# Patient Record
Sex: Female | Born: 1985 | Race: White | Hispanic: No | Marital: Married | Smoking: Current every day smoker
Health system: Southern US, Community
[De-identification: ages and names within clinical notes are randomized; demographics above are authoritative.]

## PROBLEM LIST (undated history)

## (undated) DIAGNOSIS — K219 Gastro-esophageal reflux disease without esophagitis: Secondary | ICD-10-CM

## (undated) DIAGNOSIS — D649 Anemia, unspecified: Secondary | ICD-10-CM

## (undated) DIAGNOSIS — J45909 Unspecified asthma, uncomplicated: Secondary | ICD-10-CM

## (undated) DIAGNOSIS — G43909 Migraine, unspecified, not intractable, without status migrainosus: Secondary | ICD-10-CM

## (undated) DIAGNOSIS — B019 Varicella without complication: Secondary | ICD-10-CM

## (undated) DIAGNOSIS — B977 Papillomavirus as the cause of diseases classified elsewhere: Secondary | ICD-10-CM

## (undated) DIAGNOSIS — R519 Headache, unspecified: Secondary | ICD-10-CM

## (undated) DIAGNOSIS — N946 Dysmenorrhea, unspecified: Secondary | ICD-10-CM

## (undated) DIAGNOSIS — F32A Depression, unspecified: Secondary | ICD-10-CM

## (undated) DIAGNOSIS — F419 Anxiety disorder, unspecified: Secondary | ICD-10-CM

## (undated) DIAGNOSIS — F329 Major depressive disorder, single episode, unspecified: Secondary | ICD-10-CM

## (undated) DIAGNOSIS — R51 Headache: Secondary | ICD-10-CM

## (undated) DIAGNOSIS — J302 Other seasonal allergic rhinitis: Secondary | ICD-10-CM

## (undated) HISTORY — DX: Unspecified asthma, uncomplicated: J45.909

## (undated) HISTORY — DX: Other seasonal allergic rhinitis: J30.2

## (undated) HISTORY — DX: Migraine, unspecified, not intractable, without status migrainosus: G43.909

## (undated) HISTORY — PX: PELVIC LAPAROSCOPY: SHX162

## (undated) HISTORY — DX: Varicella without complication: B01.9

## (undated) HISTORY — PX: WISDOM TOOTH EXTRACTION: SHX21

## (undated) HISTORY — DX: Headache: R51

## (undated) HISTORY — DX: Major depressive disorder, single episode, unspecified: F32.9

## (undated) HISTORY — DX: Gastro-esophageal reflux disease without esophagitis: K21.9

## (undated) HISTORY — PX: TONSILLECTOMY: SUR1361

## (undated) HISTORY — DX: Headache, unspecified: R51.9

## (undated) HISTORY — DX: Depression, unspecified: F32.A

## (undated) HISTORY — DX: Dysmenorrhea, unspecified: N94.6

## (undated) SURGERY — LAPAROSCOPY OPERATIVE
Anesthesia: General | Laterality: Bilateral

---

## 2004-09-17 ENCOUNTER — Emergency Department: Payer: Self-pay | Admitting: General Practice

## 2005-01-25 ENCOUNTER — Emergency Department: Payer: Self-pay | Admitting: Emergency Medicine

## 2005-01-26 ENCOUNTER — Ambulatory Visit: Payer: Self-pay | Admitting: Emergency Medicine

## 2005-09-12 ENCOUNTER — Emergency Department: Payer: Self-pay | Admitting: Internal Medicine

## 2005-09-13 ENCOUNTER — Inpatient Hospital Stay: Payer: Self-pay | Admitting: Internal Medicine

## 2008-08-10 ENCOUNTER — Emergency Department: Payer: Self-pay | Admitting: Emergency Medicine

## 2008-08-16 ENCOUNTER — Emergency Department: Payer: Self-pay | Admitting: Emergency Medicine

## 2008-09-29 DIAGNOSIS — F17210 Nicotine dependence, cigarettes, uncomplicated: Secondary | ICD-10-CM | POA: Insufficient documentation

## 2008-09-29 DIAGNOSIS — R87619 Unspecified abnormal cytological findings in specimens from cervix uteri: Secondary | ICD-10-CM | POA: Insufficient documentation

## 2009-03-03 ENCOUNTER — Ambulatory Visit: Payer: Self-pay | Admitting: Family Medicine

## 2009-05-02 ENCOUNTER — Emergency Department: Payer: Self-pay | Admitting: Emergency Medicine

## 2010-01-01 ENCOUNTER — Emergency Department (HOSPITAL_COMMUNITY): Admission: EM | Admit: 2010-01-01 | Discharge: 2010-01-01 | Payer: Self-pay | Admitting: Emergency Medicine

## 2011-10-02 ENCOUNTER — Emergency Department: Payer: Self-pay | Admitting: Emergency Medicine

## 2011-10-04 ENCOUNTER — Emergency Department: Payer: Self-pay | Admitting: Emergency Medicine

## 2013-05-07 ENCOUNTER — Emergency Department: Payer: Self-pay | Admitting: Internal Medicine

## 2013-05-07 LAB — URINALYSIS, COMPLETE
Bilirubin,UR: NEGATIVE
Glucose,UR: NEGATIVE mg/dL (ref 0–75)
Transitional Epi: 1
WBC UR: 516 /HPF (ref 0–5)

## 2014-05-05 ENCOUNTER — Emergency Department: Payer: Self-pay | Admitting: Emergency Medicine

## 2014-05-05 LAB — COMPREHENSIVE METABOLIC PANEL
ALBUMIN: 3.7 g/dL (ref 3.4–5.0)
ALK PHOS: 95 U/L
AST: 15 U/L (ref 15–37)
Anion Gap: 1 — ABNORMAL LOW (ref 7–16)
BUN: 10 mg/dL (ref 7–18)
Bilirubin,Total: 0.5 mg/dL (ref 0.2–1.0)
CO2: 32 mmol/L (ref 21–32)
CREATININE: 0.9 mg/dL (ref 0.60–1.30)
Calcium, Total: 8.7 mg/dL (ref 8.5–10.1)
Chloride: 106 mmol/L (ref 98–107)
GLUCOSE: 76 mg/dL (ref 65–99)
Osmolality: 275 (ref 275–301)
Potassium: 4.2 mmol/L (ref 3.5–5.1)
SGPT (ALT): 31 U/L
Sodium: 139 mmol/L (ref 136–145)
TOTAL PROTEIN: 7.4 g/dL (ref 6.4–8.2)

## 2014-05-05 LAB — URINALYSIS, COMPLETE
BILIRUBIN, UR: NEGATIVE
Blood: NEGATIVE
GLUCOSE, UR: NEGATIVE mg/dL (ref 0–75)
KETONE: NEGATIVE
NITRITE: NEGATIVE
PH: 6 (ref 4.5–8.0)
PROTEIN: NEGATIVE
RBC,UR: 2 /HPF (ref 0–5)
SPECIFIC GRAVITY: 1.019 (ref 1.003–1.030)
Squamous Epithelial: 1

## 2014-05-05 LAB — CBC WITH DIFFERENTIAL/PLATELET
BASOS PCT: 0.8 %
Basophil #: 0.1 10*3/uL (ref 0.0–0.1)
EOS ABS: 0.2 10*3/uL (ref 0.0–0.7)
Eosinophil %: 2.4 %
HCT: 45.5 % (ref 35.0–47.0)
HGB: 15.4 g/dL (ref 12.0–16.0)
LYMPHS PCT: 26.6 %
Lymphocyte #: 2.7 10*3/uL (ref 1.0–3.6)
MCH: 31.8 pg (ref 26.0–34.0)
MCHC: 33.8 g/dL (ref 32.0–36.0)
MCV: 94 fL (ref 80–100)
Monocyte #: 0.9 x10 3/mm (ref 0.2–0.9)
Monocyte %: 9.1 %
Neutrophil #: 6.1 10*3/uL (ref 1.4–6.5)
Neutrophil %: 61.1 %
Platelet: 217 10*3/uL (ref 150–440)
RBC: 4.83 10*6/uL (ref 3.80–5.20)
RDW: 13.3 % (ref 11.5–14.5)
WBC: 10 10*3/uL (ref 3.6–11.0)

## 2014-05-05 LAB — WET PREP, GENITAL

## 2014-05-06 LAB — GC/CHLAMYDIA PROBE AMP

## 2014-05-18 ENCOUNTER — Emergency Department: Payer: Self-pay | Admitting: Emergency Medicine

## 2014-12-10 ENCOUNTER — Emergency Department
Admission: EM | Admit: 2014-12-10 | Discharge: 2014-12-10 | Disposition: A | Discharge status: Home / Self Care | Payer: Medicaid Other | Attending: Emergency Medicine | Admitting: Emergency Medicine

## 2014-12-10 ENCOUNTER — Encounter: Payer: Self-pay | Admitting: *Deleted

## 2014-12-10 DIAGNOSIS — Z720 Tobacco use: Secondary | ICD-10-CM | POA: Insufficient documentation

## 2014-12-10 DIAGNOSIS — Z792 Long term (current) use of antibiotics: Secondary | ICD-10-CM | POA: Insufficient documentation

## 2014-12-10 DIAGNOSIS — N390 Urinary tract infection, site not specified: Secondary | ICD-10-CM | POA: Insufficient documentation

## 2014-12-10 DIAGNOSIS — Z3202 Encounter for pregnancy test, result negative: Secondary | ICD-10-CM | POA: Diagnosis not present

## 2014-12-10 DIAGNOSIS — Z880 Allergy status to penicillin: Secondary | ICD-10-CM | POA: Insufficient documentation

## 2014-12-10 DIAGNOSIS — R109 Unspecified abdominal pain: Secondary | ICD-10-CM | POA: Diagnosis present

## 2014-12-10 LAB — URINALYSIS COMPLETE WITH MICROSCOPIC (ARMC ONLY)
Bilirubin Urine: NEGATIVE
GLUCOSE, UA: NEGATIVE mg/dL
Ketones, ur: NEGATIVE mg/dL
NITRITE: NEGATIVE
PH: 5 (ref 5.0–8.0)
Protein, ur: NEGATIVE mg/dL
Specific Gravity, Urine: 1.027 (ref 1.005–1.030)

## 2014-12-10 LAB — BASIC METABOLIC PANEL
ANION GAP: 7 (ref 5–15)
BUN: 9 mg/dL (ref 6–20)
CALCIUM: 9.3 mg/dL (ref 8.9–10.3)
CHLORIDE: 106 mmol/L (ref 101–111)
CO2: 27 mmol/L (ref 22–32)
CREATININE: 0.77 mg/dL (ref 0.44–1.00)
GFR calc Af Amer: >60 mL/min (ref >60)
GFR calc non Af Amer: >60 mL/min (ref >60)
GLUCOSE: 98 mg/dL (ref 65–99)
Potassium: 4.3 mmol/L (ref 3.5–5.1)
Sodium: 140 mmol/L (ref 135–145)

## 2014-12-10 LAB — CBC WITH DIFFERENTIAL/PLATELET
BASOS PCT: 1 %
Basophils Absolute: 0.1 10*3/uL (ref 0–0.1)
EOS ABS: 0.4 10*3/uL (ref 0–0.7)
EOS PCT: 4 %
HCT: 44 % (ref 35.0–47.0)
Hemoglobin: 15.4 g/dL (ref 12.0–16.0)
Lymphocytes Relative: 35 %
Lymphs Abs: 3.2 10*3/uL (ref 1.0–3.6)
MCH: 32.3 pg (ref 26.0–34.0)
MCHC: 35.1 g/dL (ref 32.0–36.0)
MCV: 92.1 fL (ref 80.0–100.0)
Monocytes Absolute: 0.9 10*3/uL (ref 0.2–0.9)
Monocytes Relative: 10 %
NEUTROS PCT: 50 %
Neutro Abs: 4.8 10*3/uL (ref 1.4–6.5)
PLATELETS: 191 10*3/uL (ref 150–440)
RBC: 4.78 MIL/uL (ref 3.80–5.20)
RDW: 13.1 % (ref 11.5–14.5)
WBC: 9.4 10*3/uL (ref 3.6–11.0)

## 2014-12-10 LAB — POCT PREGNANCY, URINE: Preg Test, Ur: NEGATIVE

## 2014-12-10 LAB — HCG, QUANTITATIVE, PREGNANCY

## 2014-12-10 MED ORDER — CIPROFLOXACIN HCL 500 MG PO TABS
500.0000 mg | ORAL_TABLET | Freq: Once | ORAL | Status: AC
  Administered 2014-12-10: 500 mg via ORAL

## 2014-12-10 MED ORDER — CIPROFLOXACIN HCL 500 MG PO TABS: 500.0000 mg | ORAL_TABLET | Freq: Two times a day (BID) | ORAL | Status: AC

## 2014-12-10 MED ORDER — CIPROFLOXACIN HCL 500 MG PO TABS
ORAL_TABLET | ORAL | Status: AC
Start: 2014-12-10 — End: 2014-12-10
  Administered 2014-12-10: 500 mg via ORAL
  Filled 2014-12-10: qty 1

## 2014-12-10 NOTE — ED Provider Notes (Signed)
[facility] Emergency Department Provider Note  ____________________________________________  Time seen: 4:00  I have reviewed the triage vital signs and the nursing notes.   HISTORY  Chief Complaint Abdominal Pain      HPI Brittney Carpenter is a 29 y.o. female presents with history of positive home pregnancy test one week ago. Patient admits to actively trying to become pregnant as such no birth control at this time. Patient presents to the ER tonight with a history of accidentally slipp and fall striking her abdomen tonight.     No past medical history on file.  There are no active problems to display for this patient.   No past surgical history on file.  Current Outpatient Rx  Name  Route  Sig  Dispense  Refill  . ciprofloxacin (CIPRO) 500 MG tablet   Oral   Take 1 tablet (500 mg total) by mouth 2 (two) times daily.   10 tablet   0     Allergies Penicillins  No family history on file.  Social History History  Substance Use Topics  . Smoking status: Current Every Day Smoker  . Smokeless tobacco: Not on file  . Alcohol Use: No    Review of Systems  Constitutional: Negative for fever. Eyes: Negative for visual changes. ENT: Negative for sore throat. Cardiovascular: Negative for chest pain. Respiratory: Negative for shortness of breath. Gastrointestinal: Positive for abdominal pain. Negative for vomiting and diarrhea. Genitourinary: Negative for dysuria. Musculoskeletal: Negative for back pain. Skin: Negative for rash. Neurological: Negative for headaches, focal weakness or numbness.   10-point ROS otherwise negative.  ____________________________________________   PHYSICAL EXAM:  VITAL SIGNS: ED Triage Vitals  Enc Vitals Group     BP 12/10/14 0143 115/67 mmHg     Pulse Rate 12/10/14 0143 83     Resp 12/10/14 0143 20     Temp 12/10/14 0143 98.6 F (37 C)     Temp Source 12/10/14 0143 Oral     SpO2 12/10/14 0143 99 %      Weight 12/10/14 0143 167 lb (75.751 kg)     Height 12/10/14 0143  (1.575 m)     Head Cir --      Peak Flow --      Pain Score 12/10/14 0144 6     Pain Loc --      Pain Edu? --      Excl. in GC? --      Constitutional: Alert and oriented. Well appearing and in no distress. Eyes: Conjunctivae are normal. PERRL. Normal extraocular movements. ENT   Head: Normocephalic and atraumatic.   Nose: No congestion/rhinnorhea.   Mouth/Throat: Mucous membranes are moist.   Neck: No stridor. Hematological/Lymphatic/Immunilogical: No cervical lymphadenopathy. Cardiovascular: Normal rate, regular rhythm. Normal and symmetric distal pulses are present in all extremities. No murmurs, rubs, or gallops. Respiratory: Normal respiratory effort without tachypnea nor retractions. Breath sounds are clear and equal bilaterally. No wheezes/rales/rhonchi. Gastrointestinal: Soft and nontender. No distention. There is no CVA tenderness. Genitourinary: deferred Musculoskeletal: Nontender with normal range of motion in all extremities. No joint effusions.  No lower extremity tenderness nor edema. Neurologic:  Normal speech and language. No gross focal neurologic deficits are appreciated. Speech is normal.  Skin:  Skin is warm, dry and intact. No rash noted. Psychiatric: Mood and affect are normal. Speech and behavior are normal. Patient exhibits appropriate insight and judgment.  ____________________________________________    LABS (pertinent positives/negatives)  Labs Reviewed  URINALYSIS COMPLETEWITH MICROSCOPIC (  [facility] ONLY) - Abnormal; Notable for the following:    Color, Urine YELLOW (*)    APPearance HAZY (*)    Hgb urine dipstick 1+ (*)    Leukocytes, UA 1+ (*)    Bacteria, UA RARE (*)    Squamous Epithelial / LPF 6-30 (*)    All other components within normal limits  CBC WITH DIFFERENTIAL/PLATELET  BASIC METABOLIC PANEL  HCG, QUANTITATIVE, PREGNANCY  POC URINE PREG, ED  POCT  PREGNANCY, URINE         INITIAL IMPRESSION / ASSESSMENT AND PLAN / ED COURSE  Pertinent labs & imaging results that were available during my care of the patient were reviewed by me and considered in my medical decision making (see chart for details).  Patient's hCG quantitative less than 1 as such she was informed that she was not pregnant. Noted to have urinary tract infection on laboratory data as such ciprofloxacin was prescribed  ____________________________________________   FINAL CLINICAL IMPRESSION(S) / ED DIAGNOSES  Final diagnoses:  UTI (lower urinary tract infection)      Darci Current, MD 12/10/14 [phone]

## 2014-12-10 NOTE — Discharge Instructions (Signed)
Urinary Tract Infection °Urinary tract infections (UTIs) can develop anywhere along your urinary tract. Your urinary tract is your body's drainage system for removing wastes and extra water. Your urinary tract includes two kidneys, two ureters, a bladder, and a urethra. Your kidneys are a pair of bean-shaped organs. Each kidney is about the size of your fist. They are located below your ribs, one on each side of your spine. °CAUSES °Infections are caused by microbes, which are microscopic organisms, including fungi, viruses, and bacteria. These organisms are so small that they can only be seen through a microscope. Bacteria are the microbes that most commonly cause UTIs. °SYMPTOMS  °Symptoms of UTIs may vary by age and gender of the patient and by the location of the infection. Symptoms in young women typically include a frequent and intense urge to urinate and a painful, burning feeling in the bladder or urethra during urination. Older women and men are more likely to be tired, shaky, and weak and have muscle aches and abdominal pain. A fever may mean the infection is in your kidneys. Other symptoms of a kidney infection include pain in your back or sides below the ribs, nausea, and vomiting. °DIAGNOSIS °To diagnose a UTI, your caregiver will ask you about your symptoms. Your caregiver also will ask to provide a urine sample. The urine sample will be tested for bacteria and white blood cells. White blood cells are made by your body to help fight infection. °TREATMENT  °Typically, UTIs can be treated with medication. Because most UTIs are caused by a bacterial infection, they usually can be treated with the use of antibiotics. The choice of antibiotic and length of treatment depend on your symptoms and the type of bacteria causing your infection. °HOME CARE INSTRUCTIONS °· If you were prescribed antibiotics, take them exactly as your caregiver instructs you. Finish the medication even if you feel better after you  have only taken some of the medication. °· Drink enough water and fluids to keep your urine clear or pale yellow. °· Avoid caffeine, tea, and carbonated beverages. They tend to irritate your bladder. °· Empty your bladder often. Avoid holding urine for long periods of time. °· Empty your bladder before and after sexual intercourse. °· After a bowel movement, women should cleanse from front to back. Use each tissue only once. °SEEK MEDICAL CARE IF:  °· You have back pain. °· You develop a fever. °· Your symptoms do not begin to resolve within 3 days. °SEEK IMMEDIATE MEDICAL CARE IF:  °· You have severe back pain or lower abdominal pain. °· You develop chills. °· You have nausea or vomiting. °· You have continued burning or discomfort with urination. °MAKE SURE YOU:  °· Understand these instructions. °· Will watch your condition. °· Will get help right away if you are not doing well or get worse. °Document Released: 03/14/2005 Document Revised: 12/04/2011 Document Reviewed: 07/13/2011 °ExitCare® Patient Information ©2015 [employer]. This information is not intended to replace advice given to you by your health care provider. Make sure you discuss any questions you have with your health care provider. ° °

## 2014-12-10 NOTE — ED Notes (Signed)
Pt has low abd pain.  States she was outside tonight, slipped in the rain and fell.  Pt fell onto the sidewalk.  No vag bleeding.  States positive home preg test 1 week ago.

## 2015-02-08 ENCOUNTER — Ambulatory Visit (INDEPENDENT_AMBULATORY_CARE_PROVIDER_SITE_OTHER): Payer: Medicaid Other | Admitting: Family Medicine

## 2015-02-08 ENCOUNTER — Encounter: Payer: Self-pay | Admitting: Family Medicine

## 2015-02-08 VITALS — BP 108/75 | HR 81 | Resp 16 | Ht 63.0 in | Wt 165.6 lb

## 2015-02-08 DIAGNOSIS — Z7689 Persons encountering health services in other specified circumstances: Secondary | ICD-10-CM

## 2015-02-08 DIAGNOSIS — J4520 Mild intermittent asthma, uncomplicated: Secondary | ICD-10-CM

## 2015-02-08 DIAGNOSIS — F32A Depression, unspecified: Secondary | ICD-10-CM

## 2015-02-08 DIAGNOSIS — K2100 Gastro-esophageal reflux disease with esophagitis, without bleeding: Secondary | ICD-10-CM

## 2015-02-08 DIAGNOSIS — Z7189 Other specified counseling: Secondary | ICD-10-CM

## 2015-02-08 DIAGNOSIS — K219 Gastro-esophageal reflux disease without esophagitis: Secondary | ICD-10-CM | POA: Insufficient documentation

## 2015-02-08 DIAGNOSIS — F329 Major depressive disorder, single episode, unspecified: Secondary | ICD-10-CM

## 2015-02-08 DIAGNOSIS — J45909 Unspecified asthma, uncomplicated: Secondary | ICD-10-CM | POA: Insufficient documentation

## 2015-02-08 DIAGNOSIS — R102 Pelvic and perineal pain: Secondary | ICD-10-CM

## 2015-02-08 LAB — POCT URINALYSIS DIPSTICK
Bilirubin, UA: NEGATIVE
GLUCOSE UA: NEGATIVE
Ketones, UA: NEGATIVE
Leukocytes, UA: NEGATIVE
Nitrite, UA: NEGATIVE
UROBILINOGEN UA: 0.2
pH, UA: 8

## 2015-02-08 LAB — POCT URINE PREGNANCY: PREG TEST UR: NEGATIVE

## 2015-02-08 NOTE — Progress Notes (Signed)
Name: Brittney Carpenter   MRN: [id number]    DOB: 1985/11/22   Date:02/08/2015       Progress Note  Subjective  Chief Complaint  Chief Complaint  Patient presents with  . Establish Care  . Abdominal Pain    IUD removed x 67month ago. ER visit [facility] x 2 mo ago. LMP abnormal 01/19/2015. PAP 335mogo and no results were sent to patient from [facility].HX of Abnormal PAPand Family Hx Cervical Cancer and Cysts.     HPI  Here to establish care.  Hx. Of depression.  [facility] by [facility] Trying to get Pregnant.  Hd IUD removed ([place]) 3 mos. Ago.  Has had lower abd/pelvic pain ever since.  Has mild asthma and smokes.  Has GERD, conrtolled with OTC PPI.    No problem-specific assessment & plan notes found for this encounter.   Past Medical History  Diagnosis Date  . Chicken pox   . Seasonal allergies   . Asthma   . Depression   . Frequent headaches   . GERD (gastroesophageal reflux disease)   . Migraine     Past Surgical History  Procedure Laterality Date  . Tonsillectomy      Family History  Problem Relation Age of Onset  . Alcohol abuse Mother   . Colon polyps Mother   . Cancer Mother     Cervical  . Alcohol abuse Father     Social History   Social History  . Marital Status: Married    Spouse Name: N/A  . Number of Children: N/A  . Years of Education: N/A   Occupational History  . Not on file.   Social History Main Topics  . Smoking status: Current Every Day Smoker    Types: Cigarettes  . Smokeless tobacco: Never Used  . Alcohol Use: Yes  . Drug Use: No  . Sexual Activity: Yes     Comment: FAMILY PLANNING : Trying to get pregnant.    Other Topics Concern  . Not on file   Social History Narrative     Current outpatient prescriptions:  .  albuterol (PROVENTIL HFA;VENTOLIN HFA) 108 (90 BASE) MCG/ACT inhaler, Inhale 2 puffs into the lungs every 6 (six) hours as needed., Disp: , Rfl:  .  ibuprofen (ADVIL,MOTRIN) 400 MG tablet, Take 400 mg by  mouth every 6 (six) hours as needed., Disp: , Rfl:  .  Loratadine 10 MG CAPS, Take 10 mg by mouth daily., Disp: , Rfl:  .  omeprazole (PRILOSEC OTC) 20 MG tablet, Take 20 mg by mouth daily., Disp: , Rfl:  .  sertraline (ZOLOFT) 50 MG tablet, Take 50 mg by mouth daily., Disp: , Rfl:   Allergies  Allergen Reactions  . Penicillins Other (See Comments)     Review of Systems  Constitutional: Negative for fever, chills and malaise/fatigue.  HENT: Negative for hearing loss.   Eyes: Negative for blurred vision and double vision.  Respiratory: Positive for shortness of breath and wheezing. Negative for cough and sputum production.   Cardiovascular: Negative for chest pain, palpitations, orthopnea and leg swelling.  Gastrointestinal: Positive for abdominal pain. Negative for heartburn, nausea, vomiting and blood in stool.  Genitourinary: Negative for dysuria, urgency and frequency.  Skin: Negative for rash.  Neurological: Negative for dizziness, tingling, sensory change, focal weakness, weakness and headaches.  Psychiatric/Behavioral: Positive for depression. The patient is not nervous/anxious.       Objective  Filed Vitals:   02/08/15 1437  BP:  108/75  Pulse: 81  Resp: 16  Height: _0  (1.6 m)  Weight: 165 lb 9.6 oz (75.116 kg)    Physical Exam  Constitutional: She is well-developed, well-nourished, and in no distress. No distress.  HENT:  Head: Normocephalic and atraumatic.  Eyes: Conjunctivae and EOM are normal. Pupils are equal, round, and reactive to light. No scleral icterus.  Neck: Normal range of motion. Neck supple. Carotid bruit is not present. No thyromegaly present.  Cardiovascular: Normal rate, regular rhythm, normal heart sounds and intact distal pulses.  Exam reveals no gallop and no friction rub.   No murmur heard. Pulmonary/Chest: Effort normal and breath sounds normal. No respiratory distress. She has no wheezes. She has no rales.  Abdominal: Soft. She exhibits  no distension and no mass. There is no tenderness.  Musculoskeletal: She exhibits no edema.  Lymphadenopathy:    She has no cervical adenopathy.  Skin: Skin is warm and dry.  Vitals reviewed.      Recent Results (from the past 2160 hour(s))  CBC WITH DIFFERENTIAL     Status: None   Collection Time: 12/10/14  1:49 AM  Result Value Ref Range   WBC 9.4 3.6 - 11.0 K/uL   RBC 4.78 3.80 - 5.20 MIL/uL   Hemoglobin 15.4 12.0 - 16.0 g/dL   HCT 44.0 35.0 - 47.0 %   MCV 92.1 80.0 - 100.0 fL   MCH 32.3 26.0 - 34.0 pg   MCHC 35.1 32.0 - 36.0 g/dL   RDW 13.1 11.5 - 14.5 %   Platelets 191 150 - 440 K/uL   Neutrophils Relative % 50 %   Neutro Abs 4.8 1.4 - 6.5 K/uL   Lymphocytes Relative 35 %   Lymphs Abs 3.2 1.0 - 3.6 K/uL   Monocytes Relative 10 %   Monocytes Absolute 0.9 0.2 - 0.9 K/uL   Eosinophils Relative 4 %   Eosinophils Absolute 0.4 0 - 0.7 K/uL   Basophils Relative 1 %   Basophils Absolute 0.1 0 - 0.1 K/uL  Basic metabolic panel     Status: None   Collection Time: 12/10/14  1:49 AM  Result Value Ref Range   Sodium 140 135 - 145 mmol/L   Potassium 4.3 3.5 - 5.1 mmol/L   Chloride 106 101 - 111 mmol/L   CO2 27 22 - 32 mmol/L   Glucose, Bld 98 65 - 99 mg/dL   BUN 9 6 - 20 mg/dL   Creatinine, Ser 0.77 0.44 - 1.00 mg/dL   Calcium 9.3 8.9 - 10.3 mg/dL   GFR calc non Af Amer >60 >60 mL/min   GFR calc Af Amer >60 >60 mL/min    Comment: (NOTE) The eGFR has been calculated using the CKD EPI equation. This calculation has not been validated in all clinical situations. eGFR's persistently <60 mL/min signify possible Chronic Kidney Disease.    Anion gap 7 5 - 15  Urinalysis complete, with microscopic ([facility] only)     Status: Abnormal   Collection Time: 12/10/14  1:49 AM  Result Value Ref Range   Color, Urine YELLOW (A) YELLOW   APPearance HAZY (A) CLEAR   Glucose, UA NEGATIVE NEGATIVE mg/dL   Bilirubin Urine NEGATIVE NEGATIVE   Ketones, ur NEGATIVE NEGATIVE mg/dL   Specific  Gravity, Urine 1.027 1.005 - 1.030   Hgb urine dipstick 1+ (A) NEGATIVE   pH 5.0 5.0 - 8.0   Protein, ur NEGATIVE NEGATIVE mg/dL   Nitrite NEGATIVE NEGATIVE   Leukocytes, UA 1+ (  A) NEGATIVE   RBC / HPF 0-5 0 - 5 RBC/hpf   WBC, UA 6-30 0 - 5 WBC/hpf   Bacteria, UA RARE (A) NONE SEEN   Squamous Epithelial / LPF 6-30 (A) NONE SEEN   Mucous PRESENT    Ca Oxalate Crys, UA PRESENT   hCG, quantitative, pregnancy     Status: None   Collection Time: 12/10/14  1:49 AM  Result Value Ref Range   hCG, Beta Chain, Quant, S <1 <5 mIU/mL    Comment:          GEST. AGE      CONC.  (mIU/mL)   <=1 WEEK        5 - 50     2 WEEKS       50 - 500     3 WEEKS       100 - 10,000     4 WEEKS     1,000 - 30,000     5 WEEKS     3,500 - 115,000   6-8 WEEKS     12,000 - 270,000    12 WEEKS     15,000 - 220,000        FEMALE AND NON-PREGNANT FEMALE:     LESS THAN 5 mIU/mL   Pregnancy, urine POC     Status: None   Collection Time: 12/10/14  1:57 AM  Result Value Ref Range   Preg Test, Ur NEGATIVE NEGATIVE    Comment:        THE SENSITIVITY OF THIS METHODOLOGY IS >24 mIU/mL   POCT urine pregnancy     Status: Normal   Collection Time: 02/08/15  2:58 PM  Result Value Ref Range   Preg Test, Ur Negative Negative  POCT urinalysis dipstick     Status: Abnormal   Collection Time: 02/08/15  3:00 PM  Result Value Ref Range   Color, UA Straw    Clarity, UA Cloudy    Glucose, UA Negative    Bilirubin, UA Negative    Ketones, UA Negative    Spec Grav, UA <=1.005    Blood, UA TRACE    pH, UA 8.0    Protein, UA TRACE    Urobilinogen, UA 0.2    Nitrite, UA Negative    Leukocytes, UA Negative Negative     Assessment & Plan  Problem List Items Addressed This Visit      Respiratory   Asthma   Relevant Medications   albuterol (PROVENTIL HFA;VENTOLIN HFA) 108 (90 BASE) MCG/ACT inhaler     Digestive   Acid reflux   Relevant Medications   omeprazole (PRILOSEC OTC) 20 MG tablet     Other   Depression    Relevant Medications   sertraline (ZOLOFT) 50 MG tablet    Other Visit Diagnoses    Encounter to establish care    -  Primary    Pain of upper abdomen        Relevant Orders    POCT urinalysis dipstick (Completed)    POCT urine pregnancy (Completed)       Meds ordered this encounter  Medications  . albuterol (PROVENTIL HFA;VENTOLIN HFA) 108 (90 BASE) MCG/ACT inhaler    Sig: Inhale 2 puffs into the lungs every 6 (six) hours as needed.  Marland Kitchen ibuprofen (ADVIL,MOTRIN) 400 MG tablet    Sig: Take 400 mg by mouth every 6 (six) hours as needed.  . sertraline (ZOLOFT) 50 MG tablet    Sig: Take 50 mg  by mouth daily.  Marland Kitchen omeprazole (PRILOSEC OTC) 20 MG tablet    Sig: Take 20 mg by mouth daily.  . Loratadine 10 MG CAPS    Sig: Take 10 mg by mouth daily.   1. Encounter to establish care   2. Pelvic pain in female  - POCT urinalysis dipstick - POCT urine pregnancy - CBC with Differential - Comprehensive Metabolic Panel (CMET) - Ambulatory referral to Gynecology  3. Depression   4. Gastroesophageal reflux disease with esophagitis  - CBC with Differential  5. Asthma, mild intermittent, uncomplicated

## 2015-02-08 NOTE — Patient Instructions (Addendum)
Discussed stopping smoking.  Get release of med info form signed for Health Dept records  [facility])

## 2015-03-14 ENCOUNTER — Telehealth: Payer: Self-pay | Admitting: Family Medicine

## 2015-03-14 NOTE — Telephone Encounter (Signed)
Pt was calling about [place]. Pt has not had bloodwork done yet. Pt informed she would have US done through [facility]. Nothing further needed.[place]

## 2015-03-14 NOTE — Telephone Encounter (Signed)
Pt return call pt call call back # is  # [phone]

## 2015-03-17 ENCOUNTER — Ambulatory Visit: Payer: Medicaid Other | Admitting: Family Medicine

## 2015-03-23 ENCOUNTER — Encounter: Payer: Self-pay | Admitting: Obstetrics and Gynecology

## 2015-03-23 ENCOUNTER — Ambulatory Visit (INDEPENDENT_AMBULATORY_CARE_PROVIDER_SITE_OTHER): Payer: Medicaid Other | Admitting: Obstetrics and Gynecology

## 2015-03-23 VITALS — BP 113/75 | HR 77 | Ht 62.0 in | Wt 163.5 lb

## 2015-03-23 DIAGNOSIS — R102 Pelvic and perineal pain: Secondary | ICD-10-CM | POA: Diagnosis not present

## 2015-03-23 DIAGNOSIS — N946 Dysmenorrhea, unspecified: Secondary | ICD-10-CM | POA: Diagnosis not present

## 2015-03-23 DIAGNOSIS — Z8742 Personal history of other diseases of the female genital tract: Secondary | ICD-10-CM

## 2015-03-23 DIAGNOSIS — N941 Unspecified dyspareunia: Secondary | ICD-10-CM | POA: Insufficient documentation

## 2015-03-23 DIAGNOSIS — F526 Dyspareunia not due to a substance or known physiological condition: Secondary | ICD-10-CM

## 2015-03-23 DIAGNOSIS — N912 Amenorrhea, unspecified: Secondary | ICD-10-CM

## 2015-03-23 DIAGNOSIS — G8929 Other chronic pain: Secondary | ICD-10-CM | POA: Diagnosis not present

## 2015-03-23 DIAGNOSIS — N949 Unspecified condition associated with female genital organs and menstrual cycle: Secondary | ICD-10-CM

## 2015-03-23 DIAGNOSIS — IMO0001 Reserved for inherently not codable concepts without codable children: Secondary | ICD-10-CM

## 2015-03-23 LAB — POCT URINALYSIS DIPSTICK
BILIRUBIN UA: NEGATIVE
Glucose, UA: NEGATIVE
KETONES UA: NEGATIVE
Nitrite, UA: NEGATIVE
PH UA: 6
Protein, UA: NEGATIVE
Spec Grav, UA: 1.01
Urobilinogen, UA: 0.2

## 2015-03-23 LAB — POCT URINE PREGNANCY: Preg Test, Ur: NEGATIVE

## 2015-03-23 NOTE — Patient Instructions (Signed)
1.Urine pregnancy test is negative.  Exam is suggestive of starting menses at this time. 2.  Ultrasound is scheduled. 3.  Laparoscopy with peritoneal biopsies recommended and scheduled to rule out endometriosis. 4.  Urine chlamydia/gonorrhea testing is completed. 5.  Continue with Advil and tramadol for pain relief as currently taking it. 6.  Return for preop appointment. 7.  Literature on endometriosis and laparoscopy is given.

## 2015-03-23 NOTE — Progress Notes (Signed)
NEW PATIENT EVALUATION.  Referring physician: Dr. Venora Maples, Junior Chief complaint: Abdominal pain.  OBJECTIVE: The patient is a 29 year old white single female, para 76, in a monogamous relationship, currently trying to conceive following IUD removal in June 2016, presents in referral from Dr. Venora Maples, Junior, for evaluation of recent exacerbation of chronic pelvic pain.    Past GYN history: Patient had regular cycles prior to Mirena IUD use. Long history of severe dysmenorrhea with midline cramping and occasional low backache. Birth control pills help decrease dysmenorrhea symptomatology. Patient has lost work and school time due to dysmenorrhea. Over the past 5 years with IUD insertion, patient has been asymptomatic and amenorrheic. Since IUD removal.  The patient has had return of regular cycles.  She does experience chronic pelvic pain on and every other day basis and has exacerbations with deep thrusting dyspareunia with the pain lasting greater than 24 hours after sex.  She has been having to take Advil.  Tramadol for the pain. Patient is also experiencing painful defecation. Patient also has been experiencing nausea with 7 pound weight loss since her IUD removal. No family history of endometriosis to her knowledge.  Past Medical History  Diagnosis Date  . Chicken pox   . Seasonal allergies   . Asthma   . Depression   . Frequent headaches   . GERD (gastroesophageal reflux disease)   . Migraine    Past Surgical History  Procedure Laterality Date  . Tonsillectomy     PAST OB HISTORY: G1, SVD, 6 lbs. 10 oz. Female. G2 SVD, 6 lbs. 9 oz. Female, G3, SVD 7 lbs. 0 oz. Female  FAMILY HISTORY: TWO Maternal aunts Have had female cancers, uncertainType Breast cancer-maternal aunt. Cervical cancer-mom  SOCIAL HISTORY: Tobacco user-1 pack per day. Alcohol use-social. Drug use-Negative.  OBJECTIVE: BP 113/75 mmHg  Pulse 77  Ht  (1.575 m)  Wt 163 lb 8 oz  (74.163 kg)  BMI 29.90 kg/m2  LMP 02/15/2015 (Exact Date)  Pleasant, well-appearing white female in no acute distress. Alert and oriented. Neck-supple without thyromegaly or adenopathy. Back-no CVA tenderness. Abdomen-soft, nontender, without organomegaly.  Area. Pelvic exam: External genitalia and-normal BUS-normal. Vagina-normal. Cervix-minimal blood loss (consistent with menses); mild cervical motion tenderness 1/4;; No gross lesions Uterus-midplane, mobile, tender 2/4, normal size and shape. Adnexa-nonpalpable, nontender. Rectovaginal-normal.  External exam; normal sphincter tone,; posterior uterine tenderness  2/4 Extremities-without clubbing, cyanosis or edema Musculoskeletal - normal. Skin-no lesions; tattoo, right lower abdomen  ASSESSMENT: 1.  Chronic pelvic pain, worsening, with dysmenorrhea and deep thrusting dyspareunia; cannot rule out endometriosis or infectious etiology (doubtful). 2.  Amenorrhea-physical exam suggestive of menses starting today.  PLAN: 1.  UPT-negative. 2.  Pelvic ultrasound. 3.  Laparoscopy with peritoneal biopsies scheduled. 4.  Continue with Advil and tramadol for pain relief. 5.  Return for preop appointment. 6.  Literature on endometriosis and laparoscopy

## 2015-03-24 LAB — GC/CHLAMYDIA PROBE AMP
CHLAMYDIA, DNA PROBE: NEGATIVE
Neisseria gonorrhoeae by PCR: NEGATIVE

## 2015-03-24 LAB — URINE CULTURE: ORGANISM ID, BACTERIA: NO GROWTH

## 2015-03-29 ENCOUNTER — Telehealth: Payer: Self-pay | Admitting: Family Medicine

## 2015-03-29 ENCOUNTER — Ambulatory Visit: Payer: Medicaid Other

## 2015-03-29 DIAGNOSIS — R102 Pelvic and perineal pain: Secondary | ICD-10-CM

## 2015-03-29 LAB — CBC WITH DIFFERENTIAL/PLATELET
BASOS ABS: 0 10*3/uL (ref 0.0–0.2)
Basos: 0 %
EOS (ABSOLUTE): 0.3 10*3/uL (ref 0.0–0.4)
Eos: 4 %
HEMOGLOBIN: 15.2 g/dL (ref 11.1–15.9)
Hematocrit: 43.4 % (ref 34.0–46.6)
IMMATURE GRANS (ABS): 0 10*3/uL (ref 0.0–0.1)
IMMATURE GRANULOCYTES: 0 %
LYMPHS: 34 %
Lymphocytes Absolute: 2.4 10*3/uL (ref 0.7–3.1)
MCH: 31.8 pg (ref 26.6–33.0)
MCHC: 35 g/dL (ref 31.5–35.7)
MCV: 91 fL (ref 79–97)
MONOCYTES: 8 %
Monocytes Absolute: 0.6 10*3/uL (ref 0.1–0.9)
NEUTROS ABS: 3.8 10*3/uL (ref 1.4–7.0)
NEUTROS PCT: 54 %
Platelets: 201 10*3/uL (ref 150–379)
RBC: 4.78 x10E6/uL (ref 3.77–5.28)
RDW: 13.1 % (ref 12.3–15.4)
WBC: 7.1 10*3/uL (ref 3.4–10.8)

## 2015-03-29 NOTE — Telephone Encounter (Signed)
We will call patient once Dr. Juanetta Gosling replies.

## 2015-03-29 NOTE — Telephone Encounter (Signed)
Pt states she had her labs, [place] this  morning

## 2015-03-30 LAB — COMPREHENSIVE METABOLIC PANEL
A/G RATIO: 1.8 (ref 1.1–2.5)
ALK PHOS: 81 IU/L (ref 39–117)
ALT: 25 IU/L (ref 0–32)
AST: 21 IU/L (ref 0–40)
Albumin: 4.2 g/dL (ref 3.5–5.5)
BUN/Creatinine Ratio: 10 (ref 8–20)
BUN: 8 mg/dL (ref 6–20)
Bilirubin Total: 0.3 mg/dL (ref 0.0–1.2)
CO2: 25 mmol/L (ref 18–29)
Calcium: 9.1 mg/dL (ref 8.7–10.2)
Chloride: 103 mmol/L (ref 97–108)
Creatinine, Ser: 0.84 mg/dL (ref 0.57–1.00)
GFR calc Af Amer: 108 mL/min/{1.73_m2} (ref >59)
GFR, EST NON AFRICAN AMERICAN: 94 mL/min/{1.73_m2} (ref >59)
GLOBULIN, TOTAL: 2.4 g/dL (ref 1.5–4.5)
Glucose: 95 mg/dL (ref 65–99)
POTASSIUM: 4.2 mmol/L (ref 3.5–5.2)
SODIUM: 142 mmol/L (ref 134–144)
Total Protein: 6.6 g/dL (ref 6.0–8.5)

## 2015-03-31 ENCOUNTER — Telehealth: Payer: Self-pay | Admitting: Family Medicine

## 2015-03-31 NOTE — Telephone Encounter (Signed)
Pt was returning nurses call pt call back # is [phone]

## 2015-03-31 NOTE — Telephone Encounter (Signed)
Patient aware of labs and no pregnancy at this time. Patient says she is having a lot of pain and requesting something for pain. Pt informed that she can call gyn and ask for something.

## 2015-05-17 ENCOUNTER — Telehealth: Payer: Self-pay | Admitting: Obstetrics and Gynecology

## 2015-05-17 NOTE — Telephone Encounter (Signed)
Pt is taking tramadol waiting on surgery. It is not working, she wants something stronger, no vicodin makes her sick.  [facility]

## 2015-05-18 ENCOUNTER — Telehealth: Payer: Self-pay | Admitting: Obstetrics and Gynecology

## 2015-05-18 ENCOUNTER — Ambulatory Visit: Payer: Medicaid Other | Admitting: Family Medicine

## 2015-05-18 NOTE — Telephone Encounter (Signed)
Pt called and she stated that she called yesterday to get something for pain, and she was told that you were going to talk to Dr Tommi Rumpse to see if he would allow her to have something, pt was calling in to find out if her was going to give her something, she stated if you need to call her you can leave a message if she does not answer to [place] a [place]

## 2015-05-19 ENCOUNTER — Ambulatory Visit (INDEPENDENT_AMBULATORY_CARE_PROVIDER_SITE_OTHER): Payer: Medicaid Other | Admitting: Obstetrics and Gynecology

## 2015-05-19 ENCOUNTER — Encounter: Payer: Self-pay | Admitting: Obstetrics and Gynecology

## 2015-05-19 VITALS — BP 103/70 | HR 118 | Ht 61.0 in | Wt 160.4 lb

## 2015-05-19 DIAGNOSIS — N926 Irregular menstruation, unspecified: Secondary | ICD-10-CM | POA: Diagnosis not present

## 2015-05-19 DIAGNOSIS — Z3201 Encounter for pregnancy test, result positive: Secondary | ICD-10-CM

## 2015-05-19 LAB — POCT URINE PREGNANCY: Preg Test, Ur: POSITIVE — AB

## 2015-05-19 NOTE — Patient Instructions (Signed)
1.  Surgery is canceled because of positive pregnancy test. 2.  Since menstrual cycles are irregular, we will obtain ultrasound in 2 weeks in order to confirm gestational age [facility]

## 2015-05-19 NOTE — Progress Notes (Signed)
Patient ID: Brittney Carpenter, female   DOB: January 17, 1986, 29 y.o.   MRN: [id number]   Chief complaint: 1.  Preop. 2.  Chronic pelvic pain.  Pre- op for lap and bx- cpp hpt -pos yesterday upt pos in office lmp-04/19/2015; Menstrual cycles are irregular edd- 01/24/2016 1048w2d G4 P3003  Pos nausea- x 1week  OBJECTIVE: BP 103/70 mmHg  Pulse 118  Ht 5\' 1"  (1.549 m)  Wt 160 lb 6.4 oz (72.757 kg)  BMI 30.32 kg/m2  LMP 04/19/2015 Pleasant, well-appearing white female in no acute distress. Back: No CVA tenderness. Abdomen: Soft, nontender, without organomegaly. Pelvic exam: External genitalia normal BUS-normal. Vagina-normal Cervix-parous; no cervical motion tenderness Uterus-top normal size, mobile, nontender Adnexa-nonpalpable and nontender. Rectovaginal-normal.  External exam  ASSESSMENT: 1.  Positive UPT in office, following a positive home pregnancy test. 2.  Patient was scheduled for preop appointment for surgery today.  PLAN: 1.  Postpone laparoscopy. 2.  Return in 2 weeks for ultrasound to confirm gestational age and EDC  A total of 15 minutes were spent face-to-face with the patient during this encounter and over half of that time dealt with counseling and coordination of care.  Herold HarmsMartin A Kalayah Leske, MD  Note: This dictation was prepared with Dragon dictation along with smaller phrase technology. Any transcriptional errors that result from this process are unintentional.

## 2015-05-19 NOTE — Telephone Encounter (Signed)
appt with mad today-

## 2015-05-19 NOTE — H&P (Deleted)
  PREOPERATIVE HISTORY AND PHYSICAL  Date of surgery: 05/23/2015 Chief complaint: Abdominal pain.  SUBJECTIVE: The patient is a 29 year old white single female, para [id number], in a monogamous relationship, currently trying to conceive following IUD removal in June 2016, presents in referral from Dr. Venora MaplesJames Hawkins, Junior, for Surgical evaluation of recent exacerbation of chronic pelvic pain.   Past GYN history: Patient had regular cycles prior to Mirena IUD use. Long history of severe dysmenorrhea with midline cramping and occasional low backache. Birth control pills help decrease dysmenorrhea symptomatology. Patient has lost work and school time due to dysmenorrhea. Over the past 5 years with IUD insertion, patient has been asymptomatic and amenorrheic. Since IUD removal. The patient has had return of regular cycles. She does experience chronic pelvic pain on and every other day basis and has exacerbations with deep thrusting dyspareunia with the pain lasting greater than 24 hours after sex. She has been having to take Advil. Tramadol for the pain. Patient is also experiencing painful defecation. Patient also has been experiencing nausea with 7 pound weight loss since her IUD removal. No family history of endometriosis to her knowledge.  Past Medical History  Diagnosis Date  . Chicken pox   . Seasonal allergies   . Asthma   . Depression   . Frequent headaches   . GERD (gastroesophageal reflux disease)   . Migraine    Past Surgical History  Procedure Laterality Date  . Tonsillectomy     PAST OB HISTORY: G1, SVD, 6 lbs. 10 oz. Female. G2 SVD, 6 lbs. 9 oz. Female, G3, SVD 7 lbs. 0 oz. Female  FAMILY HISTORY: TWO Maternal aunts Have had female cancers, uncertainType Breast cancer-maternal aunt. Cervical cancer-mom  SOCIAL HISTORY: Tobacco user-1 pack per day. Alcohol use-social. Drug use-Negative.  OBJECTIVE: BP 103/70 mmHg  Pulse 118   Ht 5\' 1"  (1.549 m)  Wt 160 lb 6.4 oz (72.757 kg)  BMI 30.32 kg/m2  LMP 04/19/2015  Pleasant, well-appearing white female in no acute distress. Alert and oriented. Neck-supple without thyromegaly or adenopathy. Lungs-clear. Heart-regular rate and rhythm without murmur Back-no CVA tenderness. Abdomen-soft, nontender, without organomegaly. Pelvic exam: External genitalia and-normal BUS-normal. Vagina-normal. Cervix-minimal blood loss (consistent with menses); mild cervical motion tenderness 1/4;; No gross lesions Uterus-midplane, mobile, tender 2/4, normal size and shape. Adnexa-nonpalpable, nontender. Rectovaginal-normal. External exam; normal sphincter tone,; posterior uterine tenderness 2/4 Extremities-without clubbing, cyanosis or edema Musculoskeletal - normal. Skin-no lesions; tattoo, right lower abdomen  ASSESSMENT: 1. Chronic pelvic pain, worsening, with dysmenorrhea and deep thrusting dyspareunia  PLAN: 1.  Laparoscopy with peritoneal biopsies.  CONSENT: The patient is to undergo laparoscopy with peritoneal biopsies for evaluation of chronic pelvic pain thought to be secondary to endometriosis.  The patient is understanding of the planned procedure and is aware of and is accepting of all surgical risks which include but are not limited to bleeding, infection, pelvic organ injury with need for repair, blood clots disorders, anesthesia risks, etc.  All questions are answered.  Informed consent is given.  Patient is ready and willing to proceed with surgery as scheduled.  Herold HarmsMartin A Leroy Pettway, MD  Note: This dictation was prepared with Dragon dictation along with smaller phrase technology. Any transcriptional errors that result from this process are unintentional.

## 2015-05-20 ENCOUNTER — Other Ambulatory Visit: Payer: Medicaid Other

## 2015-05-23 ENCOUNTER — Encounter: Admission: RE | Payer: Self-pay | Source: Ambulatory Visit

## 2015-05-23 ENCOUNTER — Ambulatory Visit
Admission: RE | Admit: 2015-05-23 | Payer: Medicaid Other | Source: Ambulatory Visit | Admitting: Obstetrics and Gynecology

## 2015-05-23 SURGERY — LAPAROSCOPY, DIAGNOSTIC
Anesthesia: Choice

## 2015-05-25 ENCOUNTER — Encounter: Payer: Self-pay | Admitting: Emergency Medicine

## 2015-05-25 DIAGNOSIS — O99331 Smoking (tobacco) complicating pregnancy, first trimester: Secondary | ICD-10-CM | POA: Insufficient documentation

## 2015-05-25 DIAGNOSIS — Z3A00 Weeks of gestation of pregnancy not specified: Secondary | ICD-10-CM | POA: Diagnosis not present

## 2015-05-25 DIAGNOSIS — O23591 Infection of other part of genital tract in pregnancy, first trimester: Secondary | ICD-10-CM | POA: Diagnosis not present

## 2015-05-25 DIAGNOSIS — J45909 Unspecified asthma, uncomplicated: Secondary | ICD-10-CM | POA: Diagnosis not present

## 2015-05-25 DIAGNOSIS — Z79899 Other long term (current) drug therapy: Secondary | ICD-10-CM | POA: Insufficient documentation

## 2015-05-25 DIAGNOSIS — O209 Hemorrhage in early pregnancy, unspecified: Secondary | ICD-10-CM | POA: Diagnosis present

## 2015-05-25 DIAGNOSIS — F17210 Nicotine dependence, cigarettes, uncomplicated: Secondary | ICD-10-CM | POA: Insufficient documentation

## 2015-05-25 DIAGNOSIS — Z880 Allergy status to penicillin: Secondary | ICD-10-CM | POA: Insufficient documentation

## 2015-05-25 DIAGNOSIS — O99511 Diseases of the respiratory system complicating pregnancy, first trimester: Secondary | ICD-10-CM | POA: Insufficient documentation

## 2015-05-25 NOTE — ED Notes (Signed)
Patient ambulatory to triage with steady gait, without difficulty or distress noted; pt reports cough x 2wks; [facility] dx with [facility] and pregnancy (unknown weeks); c/o abd pain, vag spotting since yesterday and persistent nonprod cough; taking mucinex and nebs without relief

## 2015-05-26 ENCOUNTER — Emergency Department
Admission: EM | Admit: 2015-05-26 | Discharge: 2015-05-26 | Disposition: A | Discharge status: Home / Self Care | Payer: Medicaid Other | Attending: Emergency Medicine | Admitting: Emergency Medicine

## 2015-05-26 ENCOUNTER — Emergency Department: Payer: Medicaid Other

## 2015-05-26 DIAGNOSIS — R05 Cough: Secondary | ICD-10-CM

## 2015-05-26 DIAGNOSIS — J40 Bronchitis, not specified as acute or chronic: Secondary | ICD-10-CM

## 2015-05-26 DIAGNOSIS — N760 Acute vaginitis: Secondary | ICD-10-CM

## 2015-05-26 DIAGNOSIS — B9689 Other specified bacterial agents as the cause of diseases classified elsewhere: Secondary | ICD-10-CM

## 2015-05-26 DIAGNOSIS — R059 Cough, unspecified: Secondary | ICD-10-CM

## 2015-05-26 DIAGNOSIS — Z3491 Encounter for supervision of normal pregnancy, unspecified, first trimester: Secondary | ICD-10-CM

## 2015-05-26 LAB — WET PREP, GENITAL
SPERM: NONE SEEN
TRICH WET PREP: NONE SEEN
Yeast Wet Prep HPF POC: NONE SEEN

## 2015-05-26 LAB — CBC
HEMATOCRIT: 42.6 % (ref 35.0–47.0)
HEMOGLOBIN: 14.2 g/dL (ref 12.0–16.0)
MCH: 30.3 pg (ref 26.0–34.0)
MCHC: 33.3 g/dL (ref 32.0–36.0)
MCV: 90.9 fL (ref 80.0–100.0)
Platelets: 268 10*3/uL (ref 150–440)
RBC: 4.68 MIL/uL (ref 3.80–5.20)
RDW: 12.7 % (ref 11.5–14.5)
WBC: 11.9 10*3/uL — ABNORMAL HIGH (ref 3.6–11.0)

## 2015-05-26 LAB — URINALYSIS COMPLETE WITH MICROSCOPIC (ARMC ONLY)
BACTERIA UA: NONE SEEN
BILIRUBIN URINE: NEGATIVE
Glucose, UA: NEGATIVE mg/dL
Ketones, ur: NEGATIVE mg/dL
LEUKOCYTES UA: NEGATIVE
Nitrite: NEGATIVE
PH: 6 (ref 5.0–8.0)
Protein, ur: NEGATIVE mg/dL
Specific Gravity, Urine: 1.011 (ref 1.005–1.030)

## 2015-05-26 LAB — CHLAMYDIA/NGC RT PCR (ARMC ONLY)
Chlamydia Tr: NOT DETECTED
N gonorrhoeae: NOT DETECTED

## 2015-05-26 LAB — HCG, QUANTITATIVE, PREGNANCY: HCG, BETA CHAIN, QUANT, S: 906 m[IU]/mL — AB (ref <5)

## 2015-05-26 LAB — ABO/RH: ABO/RH(D): A POS

## 2015-05-26 MED ORDER — HYDROCOD POLST-CPM POLST ER 10-8 MG/5ML PO SUER: 5.0000 mL | Freq: Two times a day (BID) | ORAL | Status: DC

## 2015-05-26 MED ORDER — AZITHROMYCIN 250 MG PO TABS
500.0000 mg | ORAL_TABLET | Freq: Once | ORAL | Status: AC
  Administered 2015-05-26: 500 mg via ORAL
  Filled 2015-05-26: qty 2

## 2015-05-26 MED ORDER — AZITHROMYCIN 250 MG PO TABS: 250.0000 mg | ORAL_TABLET | Freq: Every day | ORAL | Status: DC

## 2015-05-26 MED ORDER — METRONIDAZOLE 0.75 % VA GEL: 1.0000 | Freq: Every day | VAGINAL | Status: AC

## 2015-05-26 MED ORDER — HYDROCOD POLST-CPM POLST ER 10-8 MG/5ML PO SUER
5.0000 mL | Freq: Once | ORAL | Status: AC
  Administered 2015-05-26: 5 mL via ORAL
  Filled 2015-05-26: qty 5

## 2015-05-26 NOTE — ED Provider Notes (Signed)
[facility] Emergency Department Provider Note  ____________________________________________  Time seen: Approximately 12:27 AM  I have reviewed the triage vital signs and the nursing notes.   HISTORY  Chief Complaint Cough and Vaginal Bleeding    HPI Brittney Carpenter is a 29 y.o. female who presents to the ED from home with a chief complaint of cough and vaginal spotting. Patient reports onset of nonproductive cough 2 weeks. She was seen one week ago at an outside hospital, diagnosed with URI and early pregnancy and placed on nebulizer treatments and Mucinex. States cough is persistent despite nebulizer treatments and Mucinex. Last night she experienced some light vaginal spotting which has resolved today. Complains of occasional pelvic cramping. Denies fever, chills, chest pain, shortness of breath, nausea, vomiting, diarrhea, dysuria. Last sexual intercourse 2 days ago. Denies recent travel or trauma. Nothing makes her symptoms better or worse.   Past Medical History  Diagnosis Date  . Chicken pox   . Seasonal allergies   . Asthma   . Depression   . Frequent headaches   . GERD (gastroesophageal reflux disease)   . Migraine     Patient Active Problem List   Diagnosis Date Noted  . Chronic pelvic pain in female 03/23/2015  . Dyspareunia in female 03/23/2015  . Dysmenorrhea 03/23/2015  . History of abnormal cervical Pap smear 03/23/2015  . Depression 02/08/2015  . Acid reflux 02/08/2015  . Asthma 02/08/2015  . Encounter to establish care 02/08/2015    Past Surgical History  Procedure Laterality Date  . Tonsillectomy      Current Outpatient Rx  Name  Route  Sig  Dispense  Refill  . acetaminophen (TYLENOL) 325 MG tablet   Oral   Take 650 mg by mouth.         Marland Kitchen albuterol (PROVENTIL HFA;VENTOLIN HFA) 108 (90 BASE) MCG/ACT inhaler   Inhalation   Inhale 2 puffs into the lungs every 6 (six) hours as needed.         Marland Kitchen guaiFENesin (MUCINEX)  600 MG 12 hr tablet   Oral   Take 600 mg by mouth.         Marland Kitchen ibuprofen (ADVIL,MOTRIN) 400 MG tablet   Oral   Take 400 mg by mouth every 6 (six) hours as needed.         Marland Kitchen omeprazole (PRILOSEC OTC) 20 MG tablet   Oral   Take 20 mg by mouth daily.           Allergies Penicillins  Family History  Problem Relation Age of Onset  . Alcohol abuse Mother   . Colon polyps Mother   . Cancer Mother     Cervical  . Alcohol abuse Father   . Vascular Disease Father   . Uterine cancer Maternal Aunt   . Diabetes Maternal Grandmother   . Heart disease Neg Hx     Social History Social History  Substance Use Topics  . Smoking status: Current Every Day Smoker -- 0.50 packs/day    Types: Cigarettes  . Smokeless tobacco: Never Used  . Alcohol Use: Yes     Comment: RARE    Review of Systems Constitutional: No fever/chills Eyes: No visual changes. ENT: No sore throat. Cardiovascular: Denies chest pain. Respiratory: Positive for nonproductive cough 2 weeks. Denies shortness of breath. Gastrointestinal: Positive for occasional pelvic cramping.  No nausea, no vomiting.  No diarrhea.  No constipation. Genitourinary: Positive for vaginal spotting last evening. Negative for dysuria. Musculoskeletal: Negative for  back pain. Skin: Negative for rash. Neurological: Negative for headaches, focal weakness or numbness.  10-point ROS otherwise negative.  ____________________________________________   PHYSICAL EXAM:  VITAL SIGNS: ED Triage Vitals  Enc Vitals Group     BP 05/25/15 2331 121/67 mmHg     Pulse Rate 05/25/15 2331 109     Resp 05/25/15 2331 18     Temp 05/25/15 2331 98 F (36.7 C)     Temp Source 05/25/15 2331 Oral     SpO2 05/25/15 2331 95 %     Weight 05/25/15 2331 160 lb (72.576 kg)     Height 05/25/15 2331  (1.549 m)     Head Cir --      Peak Flow --      Pain Score 05/25/15 2331 6     Pain Loc --      Pain Edu? --      Excl. in GC? --      Constitutional: Alert and oriented. Well appearing and in no acute distress. Eyes: Conjunctivae are normal. PERRL. EOMI. Head: Atraumatic. Nose: No congestion/rhinnorhea. Mouth/Throat: Mucous membranes are moist.  Oropharynx non-erythematous. Neck: No stridor.   Cardiovascular: Normal rate, regular rhythm. Grossly normal heart sounds.  Good peripheral circulation. Respiratory: Normal respiratory effort.  No retractions. Lungs CTAB. Gastrointestinal: Soft and nontender. No distention. No abdominal bruits. No CVA tenderness. Musculoskeletal: No lower extremity tenderness nor edema.  No joint effusions. Neurologic:  Normal speech and language. No gross focal neurologic deficits are appreciated. No gait instability. Skin:  Skin is warm, dry and intact. No rash noted. Psychiatric: Mood and affect are normal. Speech and behavior are normal.  ____________________________________________   LABS (all labs ordered are listed, but only abnormal results are displayed)  Labs Reviewed  WET PREP, GENITAL - Abnormal; Notable for the following:    Clue Cells Wet Prep HPF POC FEW (*)    WBC, Wet Prep HPF POC MODERATE (*)    All other components within normal limits  CBC - Abnormal; Notable for the following:    WBC 11.9 (*)    All other components within normal limits  HCG, QUANTITATIVE, PREGNANCY - Abnormal; Notable for the following:    hCG, Beta Chain, Quant, S 906 (*)    All other components within normal limits  URINALYSIS COMPLETEWITH MICROSCOPIC ([facility] ONLY) - Abnormal; Notable for the following:    Color, Urine YELLOW (*)    APPearance CLEAR (*)    Hgb urine dipstick 1+ (*)    Squamous Epithelial / LPF 0-5 (*)    All other components within normal limits  CHLAMYDIA/NGC RT PCR ([facility] ONLY)  ABO/RH   ____________________________________________  EKG  None ____________________________________________  RADIOLOGY  Ultrasound OB interpreted per Dr. Cherly Hensen: No  intrauterine gestational sac seen. No evidence of ectopic pregnancy. This remains within normal limits, given the quantitative beta HCG level of 906. Follow-up pelvic ultrasound could be considered in 2 weeks, if the patient's quantitative beta HCG continues to rise.  ____________________________________________   PROCEDURES  Procedure(s) performed:   Pelvic exam: External exam WNL without rashes, lesions or vesicles. Speculum exam reveals no vaginal bleeding, scant white discharge, closed cervical os. Bimanual exam WNL.  Critical Care performed: No  ____________________________________________   INITIAL IMPRESSION / ASSESSMENT AND PLAN / ED COURSE  Pertinent labs & imaging results that were available during my care of the patient were reviewed by me and considered in my medical decision making (see chart for details).  29 year old female who  presents with persistent cough 2 weeks and early first trimester vaginal spotting now resolved. I did offer patient a chest x-ray with abdominal shielding; we discussed it and agreed to hold imaging for now given patient's clear lung exam and no evidence of hypoxia. Will obtain pelvic ultrasound, administer Tussionex for persistent cough and reassess.  ----------------------------------------- 2:48 AM on 05/26/2015 -----------------------------------------  Patient improved. Updated patient of imaging results. Plan for MetroGel to treat bacterial vaginosis; Z-Pak for bronchitis, Tussionex for cough. Patient will follow-up with OB/GYN next week. Strict return precautions given. Patient verbalizes understanding and agrees with plan of care. _________________________________________   FINAL CLINICAL IMPRESSION(S) / ED DIAGNOSES  Final diagnoses:  Cough  Bacterial vaginosis  First trimester pregnancy  Bronchitis      Irean HongJade J Vonda Harth, MD 05/26/15 [phone]

## 2015-05-26 NOTE — Discharge Instructions (Signed)
1. Take antibiotic as prescribed (azithromycin 250 mg daily 4 days). 2. Insert MetroGel vaginal suppositories nightly 5 nights. 3. You may take cough medicine as needed (Tussionex). 4. Return to the ER for worsening symptoms, recurrent bleeding, difficulty breathing or other concerns.  Bacterial Vaginosis Bacterial vaginosis is a vaginal infection that occurs when the normal balance of bacteria in the vagina is disrupted. It results from an overgrowth of certain bacteria. This is the most common vaginal infection in women of childbearing age. Treatment is important to prevent complications, especially in pregnant women, as it can cause a premature delivery. CAUSES  Bacterial vaginosis is caused by an increase in harmful bacteria that are normally present in smaller amounts in the vagina. Several different kinds of bacteria can cause bacterial vaginosis. However, the reason that the condition develops is not fully understood. RISK FACTORS Certain activities or behaviors can put you at an increased risk of developing bacterial vaginosis, including:  Having a new sex partner or multiple sex partners.  Douching.  Using an intrauterine device (IUD) for contraception. Women do not get bacterial vaginosis from toilet seats, bedding, swimming pools, or contact with objects around them. SIGNS AND SYMPTOMS  Some women with bacterial vaginosis have no signs or symptoms. Common symptoms include:  Grey vaginal discharge.  A fishlike odor with discharge, especially after sexual intercourse.  Itching or burning of the vagina and vulva.  Burning or pain with urination. DIAGNOSIS  Your health care provider will take a medical history and examine the vagina for signs of bacterial vaginosis. A sample of vaginal fluid may be taken. Your health care provider will look at this sample under a microscope to check for bacteria and abnormal cells. A vaginal pH test may also be done.  TREATMENT  Bacterial  vaginosis may be treated with antibiotic medicines. These may be given in the form of a pill or a vaginal cream. A second round of antibiotics may be prescribed if the condition comes back after treatment. Because bacterial vaginosis increases your risk for sexually transmitted diseases, getting treated can help reduce your risk for chlamydia, gonorrhea, HIV, and herpes. HOME CARE INSTRUCTIONS   Only take over-the-counter or prescription medicines as directed by your health care provider.  If antibiotic medicine was prescribed, take it as directed. Make sure you finish it even if you start to feel better.  Tell all sexual partners that you have a vaginal infection. They should see their health care provider and be treated if they have problems, such as a mild rash or itching.  During treatment, it is important that you follow these instructions:  Avoid sexual activity or use condoms correctly.  Do not douche.  Avoid alcohol as directed by your health care provider.  Avoid breastfeeding as directed by your health care provider. SEEK MEDICAL CARE IF:   Your symptoms are not improving after 3 days of treatment.  You have increased discharge or pain.  You have a fever. MAKE SURE YOU:   Understand these instructions.  Will watch your condition.  Will get help right away if you are not doing well or get worse. FOR MORE INFORMATION  Centers for Disease Control and Prevention, Division of STD Prevention: [employer] American Sexual Health Association (ASHA): www.ashastd.org    This information is not intended to replace advice given to you by your health care provider. Make sure you discuss any questions you have with your health care provider.   Document Released: 06/04/2005 Document Revised: 06/25/2014 Document Reviewed: 01/14/2013  Elsevier Interactive Patient Education 2016 [employer] Inc[employer]  Cough, Adult Coughing is a reflex that clears your throat and your airways. Coughing  helps to heal and protect your lungs. It is normal to cough occasionally, but a cough that happens with other symptoms or lasts a long time may be a sign of a condition that needs treatment. A cough may last only 2-3 weeks (acute), or it may last longer than 8 weeks (chronic). CAUSES Coughing is commonly caused by:  Breathing in substances that irritate your lungs.  A viral or bacterial respiratory infection.  Allergies.  Asthma.  Postnasal drip.  Smoking.  Acid backing up from the stomach into the esophagus (gastroesophageal reflux).  Certain medicines.  Chronic lung problems, including COPD (or rarely, lung cancer).  Other medical conditions such as heart failure. HOME CARE INSTRUCTIONS  Pay attention to any changes in your symptoms. Take these actions to help with your discomfort:  Take medicines only as told by your health care provider.  If you were prescribed an antibiotic medicine, take it as told by your health care provider. Do not stop taking the antibiotic even if you start to feel better.  Talk with your health care provider before you take a cough suppressant medicine.  Drink enough fluid to keep your urine clear or pale yellow.  If the air is dry, use a cold steam vaporizer or humidifier in your bedroom or your home to help loosen secretions.  Avoid anything that causes you to cough at work or at home.  If your cough is worse at night, try sleeping in a semi-upright position.  Avoid cigarette smoke. If you smoke, quit smoking. If you need help quitting, ask your health care provider.  Avoid caffeine.  Avoid alcohol.  Rest as needed. SEEK MEDICAL CARE IF:   You have new symptoms.  You cough up pus.  Your cough does not get better after 2-3 weeks, or your cough gets worse.  You cannot control your cough with suppressant medicines and you are losing sleep.  You develop pain that is getting worse or pain that is not controlled with pain  medicines.  You have a fever.  You have unexplained weight loss.  You have night sweats. SEEK IMMEDIATE MEDICAL CARE IF:  You cough up blood.  You have difficulty breathing.  Your heartbeat is very fast.   This information is not intended to replace advice given to you by your health care provider. Make sure you discuss any questions you have with your health care provider.   Document Released: 12/01/2010 Document Revised: 02/23/2015 Document Reviewed: 08/11/2014 Elsevier Interactive Patient Education 2016 [employer].  First Trimester of Pregnancy The first trimester of pregnancy is from week 1 until the end of week 12 (months 1 through 3). A week after a sperm fertilizes an egg, the egg will implant on the wall of the uterus. This embryo will begin to develop into a baby. Genes from you and your partner are forming the baby. The female genes determine whether the baby is a boy or a girl. At 6-8 weeks, the eyes and face are formed, and the heartbeat can be seen on ultrasound. At the end of 12 weeks, all the baby's organs are formed.  Now that you are pregnant, you will want to do everything you can to have a healthy baby. Two of the most important things are to get good prenatal care and to follow your health care provider's instructions. Prenatal care is all the  medical care you receive before the baby's birth. This care will help prevent, find, and treat any problems during the pregnancy and childbirth. BODY CHANGES Your body goes through many changes during pregnancy. The changes vary from woman to woman.   You may gain or lose a couple of pounds at first.  You may feel sick to your stomach (nauseous) and throw up (vomit). If the vomiting is uncontrollable, call your health care provider.  You may tire easily.  You may develop headaches that can be relieved by medicines approved by your health care provider.  You may urinate more often. Painful urination may mean you have a  bladder infection.  You may develop heartburn as a result of your pregnancy.  You may develop constipation because certain hormones are causing the muscles that push waste through your intestines to slow down.  You may develop hemorrhoids or swollen, bulging veins (varicose veins).  Your breasts may begin to grow larger and become tender. Your nipples may stick out more, and the tissue that surrounds them (areola) may become darker.  Your gums may bleed and may be sensitive to brushing and flossing.  Dark spots or blotches (chloasma, mask of pregnancy) may develop on your face. This will likely fade after the baby is born.  Your menstrual periods will stop.  You may have a loss of appetite.  You may develop cravings for certain kinds of food.  You may have changes in your emotions from day to day, such as being excited to be pregnant or being concerned that something may go wrong with the pregnancy and baby.  You may have more vivid and strange dreams.  You may have changes in your hair. These can include thickening of your hair, rapid growth, and changes in texture. Some women also have hair loss during or after pregnancy, or hair that feels dry or thin. Your hair will most likely return to normal after your baby is born. WHAT TO EXPECT AT YOUR PRENATAL VISITS During a routine prenatal visit:  You will be weighed to make sure you and the baby are growing normally.  Your blood pressure will be taken.  Your abdomen will be measured to track your baby's growth.  The fetal heartbeat will be listened to starting around week 10 or 12 of your pregnancy.  Test results from any previous visits will be discussed. Your health care provider may ask you:  How you are feeling.  If you are feeling the baby move.  If you have had any abnormal symptoms, such as leaking fluid, bleeding, severe headaches, or abdominal cramping.  If you are using any tobacco products, including cigarettes,  chewing tobacco, and electronic cigarettes.  If you have any questions. Other tests that may be performed during your first trimester include:  Blood tests to find your blood type and to check for the presence of any previous infections. They will also be used to check for low iron levels (anemia) and Rh antibodies. Later in the pregnancy, blood tests for diabetes will be done along with other tests if problems develop.  Urine tests to check for infections, diabetes, or protein in the urine.  An ultrasound to confirm the proper growth and development of the baby.  An amniocentesis to check for possible genetic problems.  Fetal screens for spina bifida and Down syndrome.  You may need other tests to make sure you and the baby are doing well.  HIV (human immunodeficiency virus) testing. Routine prenatal testing includes  screening for HIV, unless you choose not to have this test. HOME CARE INSTRUCTIONS  Medicines  Follow your health care provider's instructions regarding medicine use. Specific medicines may be either safe or unsafe to take during pregnancy.  Take your prenatal vitamins as directed.  If you develop constipation, try taking a stool softener if your health care provider approves. Diet  Eat regular, well-balanced meals. Choose a variety of foods, such as meat or vegetable-based protein, fish, milk and low-fat dairy products, vegetables, fruits, and whole grain breads and cereals. Your health care provider will help you determine the amount of weight gain that is right for you.  Avoid raw meat and uncooked cheese. These carry germs that can cause birth defects in the baby.  Eating four or five small meals rather than three large meals a day may help relieve nausea and vomiting. If you start to feel nauseous, eating a few soda crackers can be helpful. Drinking liquids between meals instead of during meals also seems to help nausea and vomiting.  If you develop constipation,  eat more high-fiber foods, such as fresh vegetables or fruit and whole grains. Drink enough fluids to keep your urine clear or pale yellow. Activity and Exercise  Exercise only as directed by your health care provider. Exercising will help you:  Control your weight.  Stay in shape.  Be prepared for labor and delivery.  Experiencing pain or cramping in the lower abdomen or low back is a good sign that you should stop exercising. Check with your health care provider before continuing normal exercises.  Try to avoid standing for long periods of time. Move your legs often if you must stand in one place for a long time.  Avoid heavy lifting.  Wear low-heeled shoes, and practice good posture.  You may continue to have sex unless your health care provider directs you otherwise. Relief of Pain or Discomfort  Wear a good support bra for breast tenderness.   Take warm sitz baths to soothe any pain or discomfort caused by hemorrhoids. Use hemorrhoid cream if your health care provider approves.   Rest with your legs elevated if you have leg cramps or low back pain.  If you develop varicose veins in your legs, wear support hose. Elevate your feet for 15 minutes, 3-4 times a day. Limit salt in your diet. Prenatal Care  Schedule your prenatal visits by the twelfth week of pregnancy. They are usually scheduled monthly at first, then more often in the last 2 months before delivery.  Write down your questions. Take them to your prenatal visits.  Keep all your prenatal visits as directed by your health care provider. Safety  Wear your seat belt at all times when driving.  Make a list of emergency phone numbers, including numbers for family, friends, the hospital, and police and fire departments. General Tips  Ask your health care provider for a referral to a local prenatal education class. Begin classes no later than at the beginning of month 6 of your pregnancy.  Ask for help if you have  counseling or nutritional needs during pregnancy. Your health care provider can offer advice or refer you to specialists for help with various needs.  Do not use hot tubs, steam rooms, or saunas.  Do not douche or use tampons or scented sanitary pads.  Do not cross your legs for long periods of time.  Avoid cat litter boxes and soil used by cats. These carry germs that can cause birth defects  in the baby and possibly loss of the fetus by miscarriage or stillbirth.  Avoid all smoking, herbs, alcohol, and medicines not prescribed by your health care provider. Chemicals in these affect the formation and growth of the baby.  Do not use any tobacco products, including cigarettes, chewing tobacco, and electronic cigarettes. If you need help quitting, ask your health care provider. You may receive counseling support and other resources to help you quit.  Schedule a dentist appointment. At home, brush your teeth with a soft toothbrush and be gentle when you floss. SEEK MEDICAL CARE IF:   You have dizziness.  You have mild pelvic cramps, pelvic pressure, or nagging pain in the abdominal area.  You have persistent nausea, vomiting, or diarrhea.  You have a bad smelling vaginal discharge.  You have pain with urination.  You notice increased swelling in your face, hands, legs, or ankles. SEEK IMMEDIATE MEDICAL CARE IF:   You have a fever.  You are leaking fluid from your vagina.  You have spotting or bleeding from your vagina.  You have severe abdominal cramping or pain.  You have rapid weight gain or loss.  You vomit blood or material that looks like coffee grounds.  You are exposed to [place] measles and have never had them.  You are exposed to fifth disease or chickenpox.  You develop a severe headache.  You have shortness of breath.  You have any kind of trauma, such as from a fall or a car accident.   This information is not intended to replace advice given to you by your  health care provider. Make sure you discuss any questions you have with your health care provider.   Document Released: 05/29/2001 Document Revised: 06/25/2014 Document Reviewed: 04/14/2013 Elsevier Interactive Patient Education [employer]

## 2015-05-30 ENCOUNTER — Other Ambulatory Visit: Payer: Self-pay | Admitting: Obstetrics and Gynecology

## 2015-05-30 DIAGNOSIS — Z3687 Encounter for antenatal screening for uncertain dates: Secondary | ICD-10-CM

## 2015-06-02 ENCOUNTER — Ambulatory Visit (INDEPENDENT_AMBULATORY_CARE_PROVIDER_SITE_OTHER): Payer: Medicaid Other

## 2015-06-02 DIAGNOSIS — Z3687 Encounter for antenatal screening for uncertain dates: Secondary | ICD-10-CM

## 2015-06-02 DIAGNOSIS — Z36 Encounter for antenatal screening of mother: Secondary | ICD-10-CM

## 2015-06-17 ENCOUNTER — Ambulatory Visit (INDEPENDENT_AMBULATORY_CARE_PROVIDER_SITE_OTHER): Payer: Medicaid Other

## 2015-06-17 ENCOUNTER — Ambulatory Visit (INDEPENDENT_AMBULATORY_CARE_PROVIDER_SITE_OTHER): Payer: Medicaid Other | Admitting: Obstetrics and Gynecology

## 2015-06-17 VITALS — BP 112/65 | HR 75 | Wt 159.1 lb

## 2015-06-17 DIAGNOSIS — Z36 Encounter for antenatal screening of mother: Secondary | ICD-10-CM

## 2015-06-17 DIAGNOSIS — Z3490 Encounter for supervision of normal pregnancy, unspecified, unspecified trimester: Secondary | ICD-10-CM

## 2015-06-17 DIAGNOSIS — Z362 Encounter for other antenatal screening follow-up: Secondary | ICD-10-CM

## 2015-06-17 DIAGNOSIS — Z113 Encounter for screening for infections with a predominantly sexual mode of transmission: Secondary | ICD-10-CM

## 2015-06-17 DIAGNOSIS — Z331 Pregnant state, incidental: Secondary | ICD-10-CM | POA: Diagnosis not present

## 2015-06-17 DIAGNOSIS — Z1389 Encounter for screening for other disorder: Secondary | ICD-10-CM

## 2015-06-17 DIAGNOSIS — R638 Other symptoms and signs concerning food and fluid intake: Secondary | ICD-10-CM

## 2015-06-17 MED ORDER — CONCEPT DHA 53.5-38-1 MG PO CAPS: 1.0000 | ORAL_CAPSULE | Freq: Every day | ORAL | Status: DC

## 2015-06-17 MED ORDER — ALBUTEROL SULFATE HFA 108 (90 BASE) MCG/ACT IN AERS: 2.0000 | INHALATION_SPRAY | Freq: Four times a day (QID) | RESPIRATORY_TRACT | Status: DC | PRN

## 2015-06-17 MED ORDER — RANITIDINE HCL 150 MG PO TABS
150.0000 mg | ORAL_TABLET | Freq: Two times a day (BID) | ORAL | Status: DC
Start: 2015-06-17 — End: 2015-08-24

## 2015-06-17 NOTE — Progress Notes (Signed)
Brittney Carpenter presents for NOB nurse interview visit. G-4.  [id number]. Ultrasound repeated today. Previous ultrasound indicated low heart rate which may have been related to pt being early in pregnancy. EDD: 12/24/2015. Pregnancy education material explained and given. No cats in the home. NOB labs ordered. TSH/HbgA1c due to Increased BMI, HIV labs and Drug screen were explained optional and she could opt out of tests but did not decline. Drug screen ordered. PNV encouraged. Genetic testing to discuss with provider. FOB exposed to HEP. C, and was tested and found to be negative.  FOB had child by someone else and  was diagnosis with Trisomy 313, multiple birth defects and pregnancy was terminated at 6 months. Pt. To follow up with provider in 4 weeks for NOB physical.  All questions answered.    ZIKA EXPOSURE SCREEN:  The patient has not traveled to a [place] endemic area within the past 6 months, nor has she had unprotected sex with a partner who has travelled to a [place] endemic region within the past 6 months. The patient has been advised to notify us if these factors change any time during this current pregnancy, so adequate testing and monitoring can be initiated.

## 2015-06-17 NOTE — Patient Instructions (Signed)

## 2015-06-18 LAB — PAIN MGT SCRN (14 DRUGS), UR
AMPHETAMINE SCRN UR: NEGATIVE ng/mL
BARBITURATE SCRN UR: NEGATIVE ng/mL
BENZODIAZEPINE SCREEN, URINE: NEGATIVE ng/mL
Buprenorphine, Urine: NEGATIVE ng/mL
CANNABINOIDS UR QL SCN: NEGATIVE ng/mL
COCAINE(METAB.) SCREEN, URINE: NEGATIVE ng/mL
Creatinine(Crt), U: 218.1 mg/dL (ref 20.0–300.0)
Fentanyl, Urine: NEGATIVE pg/mL
Meperidine Screen, Urine: NEGATIVE ng/mL
Methadone Scn, Ur: NEGATIVE ng/mL
OPIATE SCRN UR: NEGATIVE ng/mL
Oxycodone+Oxymorphone Ur Ql Scn: NEGATIVE ng/mL
PCP Scrn, Ur: NEGATIVE ng/mL
PH UR, DRUG SCRN: 5.7 (ref 4.5–8.9)
PROPOXYPHENE SCREEN: NEGATIVE ng/mL
TRAMADOL UR QL SCN: NEGATIVE ng/mL

## 2015-06-18 LAB — CBC WITH DIFFERENTIAL/PLATELET
BASOS: 0 %
Basophils Absolute: 0 10*3/uL (ref 0.0–0.2)
EOS (ABSOLUTE): 0.2 10*3/uL (ref 0.0–0.4)
Eos: 3 %
Hematocrit: 44.3 % (ref 34.0–46.6)
Hemoglobin: 15.5 g/dL (ref 11.1–15.9)
IMMATURE GRANS (ABS): 0 10*3/uL (ref 0.0–0.1)
IMMATURE GRANULOCYTES: 0 %
LYMPHS: 26 %
Lymphocytes Absolute: 2 10*3/uL (ref 0.7–3.1)
MCH: 31.4 pg (ref 26.6–33.0)
MCHC: 35 g/dL (ref 31.5–35.7)
MCV: 90 fL (ref 79–97)
Monocytes Absolute: 0.7 10*3/uL (ref 0.1–0.9)
Monocytes: 9 %
NEUTROS PCT: 62 %
Neutrophils Absolute: 4.8 10*3/uL (ref 1.4–7.0)
PLATELETS: 231 10*3/uL (ref 150–379)
RBC: 4.93 x10E6/uL (ref 3.77–5.28)
RDW: 13.2 % (ref 12.3–15.4)
WBC: 7.8 10*3/uL (ref 3.4–10.8)

## 2015-06-18 LAB — RPR: RPR: NONREACTIVE

## 2015-06-18 LAB — URINALYSIS, ROUTINE W REFLEX MICROSCOPIC
Bilirubin, UA: NEGATIVE
GLUCOSE, UA: NEGATIVE
KETONES UA: NEGATIVE
Leukocytes, UA: NEGATIVE
NITRITE UA: NEGATIVE
RBC, UA: NEGATIVE
SPEC GRAV UA: 1.027 (ref 1.005–1.030)
UUROB: 0.2 mg/dL (ref 0.2–1.0)
pH, UA: 6 (ref 5.0–7.5)

## 2015-06-18 LAB — ABO

## 2015-06-18 LAB — HIV ANTIBODY (ROUTINE TESTING W REFLEX): HIV Screen 4th Generation wRfx: NONREACTIVE

## 2015-06-18 LAB — RH TYPE: RH TYPE: POSITIVE

## 2015-06-18 LAB — TSH: TSH: 1.8 u[IU]/mL (ref 0.450–4.500)

## 2015-06-18 LAB — HEMOGLOBIN A1C
ESTIMATED AVERAGE GLUCOSE: 100 mg/dL
HEMOGLOBIN A1C: 5.1 % (ref 4.8–5.6)

## 2015-06-18 LAB — ANTIBODY SCREEN: Antibody Screen: NEGATIVE

## 2015-06-18 LAB — HEPATITIS B SURFACE ANTIGEN: Hepatitis B Surface Ag: NEGATIVE

## 2015-06-18 LAB — RUBELLA ANTIBODY, IGM

## 2015-06-19 LAB — URINE CULTURE, OB REFLEX: ORGANISM ID, BACTERIA: NO GROWTH

## 2015-06-19 LAB — CULTURE, OB URINE

## 2015-06-19 NOTE — L&D Delivery Note (Signed)
Delivery Summary for Little IshikawaLisa J Zahner  Labor Events:   Preterm labor:   Rupture date:   Rupture time:   Rupture type: AROM  Fluid Color: Bloody  Induction: Pitocin for IUGR/Oligohydramnios at 35.5 weeks  Augmentation:   Complications: Suspected Abruption  Cervical ripening:          Delivery: SVD  Episiotomy: None  Lacerations: None  Repair suture: NA  Repair # of packets:   Blood loss (ml): 500   Information for the patient's newborn:  Gwyneth SproutCraven, Boy Obelia [[id number]][[id number]]    Delivery 12/28/2015 6:18 PM by  Vaginal, Spontaneous Delivery Sex:  female Gestational Age: 1248w6d Delivery Clinician:  Tawyna Pellot Living?: Yes        APGARS  One minute Five minutes Ten minutes  Skin color: 0   1      Heart rate: 2   2      Grimace: 2   2      Muscle tone: 1   2      Breathing: 2   2      Totals: 7  9      Presentation/position: Vertex     Resuscitation:   Cord information:    Disposition of cord blood:     Blood gases sent Complications:   Placenta: Delivered: 12/28/2015 6:20 PM  Spontaneous   Appearance possible marginal abruption Newborn Measurements: Weight: 4 lb 4.4 oz (1940 g)  Height:    Head circumference:    Chest circumference:    Other providers:    Additional  information: Forceps:   Vacuum:   Breech:   Observed anomalies

## 2015-06-20 LAB — GC/CHLAMYDIA PROBE AMP
Chlamydia trachomatis, NAA: NEGATIVE
Neisseria gonorrhoeae by PCR: NEGATIVE

## 2015-06-21 ENCOUNTER — Other Ambulatory Visit: Payer: Medicaid Other

## 2015-06-21 LAB — VARICELLA ZOSTER ANTIBODY, IGM

## 2015-07-05 ENCOUNTER — Other Ambulatory Visit: Payer: Self-pay

## 2015-07-05 ENCOUNTER — Telehealth: Payer: Self-pay

## 2015-07-05 DIAGNOSIS — O219 Vomiting of pregnancy, unspecified: Secondary | ICD-10-CM

## 2015-07-05 MED ORDER — PROMETHAZINE HCL 25 MG PO TABS: 25.0000 mg | ORAL_TABLET | Freq: Four times a day (QID) | ORAL | Status: DC | PRN

## 2015-07-05 NOTE — Telephone Encounter (Signed)
Pt came by office to have panorama genetic screening but I advised pt to wait until she spoke with a provider on her NOB physical visit. Has genetic issues on fob side of family.  Also sent in rx for phenergan for n/v per pt request.

## 2015-07-06 NOTE — Telephone Encounter (Signed)
Pt came by office for panorama genetic test but advised to wait until NOB physical because of fob history of triosomy, and multiple birth defects from another child by a previous relationship.
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
## 2015-07-19 ENCOUNTER — Ambulatory Visit (INDEPENDENT_AMBULATORY_CARE_PROVIDER_SITE_OTHER): Payer: Medicaid Other | Admitting: Obstetrics and Gynecology

## 2015-07-19 ENCOUNTER — Encounter: Payer: Self-pay | Admitting: Obstetrics and Gynecology

## 2015-07-19 VITALS — BP 98/64 | HR 96 | Wt 164.0 lb

## 2015-07-19 DIAGNOSIS — Z3482 Encounter for supervision of other normal pregnancy, second trimester: Secondary | ICD-10-CM | POA: Diagnosis not present

## 2015-07-19 DIAGNOSIS — Q917 Trisomy 13, unspecified: Secondary | ICD-10-CM

## 2015-07-19 DIAGNOSIS — Z3492 Encounter for supervision of normal pregnancy, unspecified, second trimester: Secondary | ICD-10-CM

## 2015-07-19 LAB — POCT URINALYSIS DIPSTICK
BILIRUBIN UA: NEGATIVE
Glucose, UA: NEGATIVE
KETONES UA: NEGATIVE
LEUKOCYTES UA: NEGATIVE
NITRITE UA: NEGATIVE
PROTEIN UA: NEGATIVE
Spec Grav, UA: 1.01
Urobilinogen, UA: NEGATIVE
pH, UA: 8

## 2015-07-19 NOTE — Progress Notes (Signed)
OBSTETRIC INITIAL PRENATAL VISIT PROGRESS NOTE  Subjective:    Brittney Carpenter is being seen today for her first obstetrical visit.  This is not a planned pregnancy. She is a [id number] at 53w5dgestation, Estimated Date of Delivery: 01/26/16 by Patient's last menstrual period of 03/19/2015 (approximate), consistent with 7 week sono.  Her obstetrical history is significant for obesity and smoker (1 ppd). Relationship with FOB: spouse, living together. Patient does intend to breast feed. Pregnancy history fully reviewed.  Husband with a h/o fathering a child with Trisomy 13 in a prior relationship.   Menstrual History: Obstetric History   G4   P3   T3   P0   A0   TAB0   SAB0   E0   M0   L3     # Outcome Date GA Lbr Len/2nd Weight Sex Delivery Anes PTL Lv  4 Current           3 Term 2011   7 lb (3.175 kg) M Vag-Spont   Y  2 Term 2006   6 lb 14.4 oz (3.13 kg) F Vag-Spont   Y  1 Term 2004   6 lb 1.6 oz (2.767 kg) F Vag-Spont   Y      Menarche age: [age]  Notes painful menses and dyspareunia.  Patient's last menstrual period was 03/19/2015 (approximate).  Denies h/o STIs.  Last pap smear was ~ 3 years ago.   . Past Medical History  Diagnosis Date  . Chicken pox   . Seasonal allergies   . Asthma   . Depression   . Frequent headaches   . GERD (gastroesophageal reflux disease)   . Migraine    Past Surgical History  Procedure Laterality Date  . Tonsillectomy      Family History  Problem Relation Age of Onset  . Alcohol abuse Mother   . Colon polyps Mother   . Cancer Mother     Cervical  . Alcohol abuse Father   . Vascular Disease Father   . Uterine cancer Maternal Aunt   . Diabetes Maternal Grandmother   . Heart disease Neg Hx     Social History   Social History  . Marital Status: Married    Spouse Name: N/A  . Number of Children: N/A  . Years of Education: N/A   Occupational History  . Not on file.   Social History Main Topics  . Smoking status: Current Every Day  Smoker -- 0.50 packs/day    Types: Cigarettes  . Smokeless tobacco: Never Used  . Alcohol Use: Yes     Comment: RARE  . Drug Use: No  . Sexual Activity: Yes    Birth Control/ Protection: None     Comment: FAMILY PLANNING : Trying to get pregnant.    Other Topics Concern  . Not on file   Social History Narrative    Current Outpatient Prescriptions on File Prior to Visit  Medication Sig Dispense Refill  . acetaminophen (TYLENOL) 325 MG tablet Take 650 mg by mouth.    .Marland Kitchenalbuterol (PROVENTIL HFA;VENTOLIN HFA) 108 (90 Base) MCG/ACT inhaler Inhale 2 puffs into the lungs every 6 (six) hours as needed for wheezing or shortness of breath. 1 Inhaler 1  . Prenat-FeFum-FePo-FA-Omega 3 (CONCEPT DHA) 53.5-38-1 MG CAPS Take 1 tablet by mouth daily. 30 capsule 11  . promethazine (PHENERGAN) 25 MG tablet Take 1 tablet (25 mg total) by mouth every 6 (six) hours as needed for nausea or vomiting.  30 tablet 1  . ranitidine (ZANTAC) 150 MG tablet Take 1 tablet (150 mg total) by mouth 2 (two) times daily. 60 tablet 11   No current facility-administered medications on file prior to visit.    Allergies  Allergen Reactions  . Penicillins Other (See Comments)     Review of Systems General:Not Present- Fever, Weight Loss and Weight Gain. Skin:Not Present- Rash. HEENT:Not Present- Blurred Vision, and Bleeding Gums. Respiratory:Not Present- Difficulty Breathing. Breast:Not Present- Breast Mass. Cardiovascular:Not Present- Chest Pain, Elevated Blood Pressure, Fainting / Blacking Out and Shortness of Breath. Gastrointestinal:Present - Nausea and Vomiting (currently on Phenergan, which helps with nausea but reports drowsiness).  Not Present- Abdominal Pain, Constipation. Female Genitourinary:Not Present- Frequency, Painful Urination, Pelvic Pain, Vaginal Bleeding, Vaginal Discharge, Contractions, regular, Fetal Movements Decreased, Urinary Complaints and Vaginal Fluid. Musculoskeletal:Not Present-  Back Pain and Leg Cramps. Neurological:Present- Dizziness and headaches (noted all last week, not relieved by Tylenol ES.  Has resolved). Not present - weakness, Visual Changes. Psychiatric:Not Present- Depression.    Objective:    BP 98/64 mmHg  Pulse 96  Wt 164 lb (74.39 kg)  LMP 03/19/2015 (Approximate) . Body mass index is 31 kg/(m^2).   General Appearance:    Alert, cooperative, no distress, appears stated age  Head:    Normocephalic, without obvious abnormality, atraumatic  Eyes:    PERRL, conjunctiva/corneas clear, EOM's intact, both eyes  Ears:    Normal external ear canals, both ears  Nose:   Nares normal, septum midline, mucosa normal, no drainage or sinus tenderness  Throat:   Lips, mucosa, and tongue normal; teeth and gums normal  Neck:   Supple, symmetrical, trachea midline, no adenopathy; thyroid: no enlargement/tenderness/nodules; no carotid bruit or JVD  Back:     Symmetric, no curvature, ROM normal, no CVA tenderness  Lungs:     Clear to auscultation bilaterally, respirations unlabored  Chest Wall:    No tenderness or deformity   Heart:    Regular rate and rhythm, S1 and S2 normal, no murmur, rub or gallop  Breast Exam:    No tenderness, masses, or nipple abnormality  Abdomen:     Soft, non-tender, bowel sounds active all four quadrants, no masses, no organomegaly.  FHT 173 bpm.  Genitalia:    Pelvic:external genitalia normal, vagina without lesions, small amount of curd-like discharge, no tenderness, rectovaginal septum  normal. Cervix normal in appearance, no cervical motion tenderness, no adnexal masses or tenderness.  Pregnancy positive findings: uterine enlargement: 12-14 wk size, nontender.   Rectal:    Normal external sphincter.  No hemorrhoids appreciated. Internal exam not done.   Extremities:   Extremities normal, atraumatic, no cyanosis or edema  Pulses:   2+ and symmetric all extremities  Skin:   Skin color, texture, turgor normal, no rashes or lesions    Lymph nodes:   Cervical, supraclavicular, and axillary nodes normal  Neurologic:   CNII-XII intact, normal strength, sensation and reflexes throughout     Assessment:    Pregnancy at 12 and 5/7 weeks   Obesity, Class I Tobacco abuse Nausea and vomiting of pregnancy Paternal history of prior pregnancy with different partner with trisomy 55 Rubella non-immune H/o asthma  Plan:    Initial labs reviewed. Patient is Rubella non-immune.  Will need MMR postpartum.  Pap smear performed today.  Prenatal vitamins. Nausea and vomiting controlled on Phenergan however patient notes not being able to function during the day due to drowsiness.  Will prescribe Zofran for daytime management.  Obesity Class 1.  Will need early glucola, will ensure patient no longer with hyperemesis prior to performing.  Tobacco abuse, currently up to 1 ppd.  Discussed smoking cessation, risks of tobacco on pregnancy.  Discussed several management options. Patient desires to perform cut-down method (decreaseing from 1-2 cig/week until cessation).  Problem list reviewed and updated. AFP3 discussed: declined.  Patient desires to perform NIPT testing.  Discussed testing involved.  Will order MaterniT21.  . Role of ultrasound in pregnancy discussed; fetal survey: to be ordered at appropriae gestational age.   H/o asthma, Patient with no recent hospitalizations or no h/o intubations.  To continue Albuterol prn, inform MD if symptoms worsen.  Follow up in 4 weeks.  50% of 30 min visit spent on counseling and coordination of care.     Rubie Maid, MD [facility]

## 2015-07-20 MED ORDER — ONDANSETRON 4 MG PO TBDP: 4.0000 mg | ORAL_TABLET | Freq: Four times a day (QID) | ORAL | Status: DC | PRN

## 2015-07-20 NOTE — Addendum Note (Signed)
Addended by: Fabian November on: 07/20/2015 05:33 PM   Modules accepted: Orders

## 2015-07-26 ENCOUNTER — Telehealth: Payer: Self-pay | Admitting: Obstetrics and Gynecology

## 2015-07-26 NOTE — Telephone Encounter (Signed)
Patient called stating her nausea med is making her too sleepy and would like something else. She also stated her son has been diagnosed with the flu and she has not had her flu shot.

## 2015-07-26 NOTE — Telephone Encounter (Signed)
Called pt she states that her nausea medicine is causing her to be very drowsy, spoke with patient and advised her that unfortunately drowsiness is the number one side effect with most prescribed n/v meds (zofran, phenergan, diclegis) advised on taking medication at night, taking medication as sparingly as possible. Also advised pt that she is too early in the pregnancy to receive a flu vaccine (not recommended until 16wks) Advised pt on good hand hygiene, lysol for the home, and wearing a mask. Pt gave verbal understanding.

## 2015-07-27 LAB — PAP IG AND HPV HIGH-RISK
HPV, high-risk: POSITIVE — AB
PAP SMEAR COMMENT: 0

## 2015-07-28 ENCOUNTER — Telehealth: Payer: Self-pay | Admitting: Obstetrics and Gynecology

## 2015-07-28 NOTE — Telephone Encounter (Signed)
Called pt informed her of the need for postpartum colpo, also informed pt that MaterniT testing has not been resulted yet, she will be informed when it does come in.

## 2015-07-28 NOTE — Telephone Encounter (Signed)
Pt called and wanted to know if her results were back yet she stated she had them done on 07/19/15

## 2015-08-01 ENCOUNTER — Telehealth: Payer: Self-pay | Admitting: Obstetrics and Gynecology

## 2015-08-01 NOTE — Telephone Encounter (Signed)
Pt calls for the results of her MaterniT testing, per message below left message for pt informing her of negative test results and female fetus.

## 2015-08-01 NOTE — Telephone Encounter (Signed)
PT HAD HR PANORAMA ON 1/31 AND SHE WAS CALLING TO SEE IF THEY WERE BACK YET, YOU CAN LEAVE A MESSAGE IF PT DOES NOT ANSWER.

## 2015-08-02 ENCOUNTER — Encounter: Payer: Self-pay | Admitting: Obstetrics and Gynecology

## 2015-08-02 ENCOUNTER — Telehealth: Payer: Self-pay | Admitting: Obstetrics and Gynecology

## 2015-08-02 NOTE — Telephone Encounter (Signed)
Pt had a panorama done 1/31. She has not got the results yet. Should they be back yet?

## 2015-08-03 NOTE — Telephone Encounter (Signed)
Patient called back and notified.KEC

## 2015-08-03 NOTE — Telephone Encounter (Signed)
Pt had a MaterniT drawn ([facility] version of Panorama) and I called and left the results on her voicemail yesterday.

## 2015-08-16 ENCOUNTER — Encounter: Payer: Medicaid Other | Admitting: Obstetrics and Gynecology

## 2015-08-24 ENCOUNTER — Ambulatory Visit (INDEPENDENT_AMBULATORY_CARE_PROVIDER_SITE_OTHER): Payer: Medicaid Other | Admitting: Obstetrics and Gynecology

## 2015-08-24 VITALS — BP 117/66 | HR 98 | Wt 164.2 lb

## 2015-08-24 DIAGNOSIS — Z3482 Encounter for supervision of other normal pregnancy, second trimester: Secondary | ICD-10-CM

## 2015-08-24 DIAGNOSIS — Z3492 Encounter for supervision of normal pregnancy, unspecified, second trimester: Secondary | ICD-10-CM

## 2015-08-24 DIAGNOSIS — O219 Vomiting of pregnancy, unspecified: Secondary | ICD-10-CM

## 2015-08-24 DIAGNOSIS — O26899 Other specified pregnancy related conditions, unspecified trimester: Secondary | ICD-10-CM

## 2015-08-24 DIAGNOSIS — G4489 Other headache syndrome: Secondary | ICD-10-CM

## 2015-08-24 DIAGNOSIS — R102 Pelvic and perineal pain: Secondary | ICD-10-CM

## 2015-08-24 DIAGNOSIS — J4520 Mild intermittent asthma, uncomplicated: Secondary | ICD-10-CM

## 2015-08-24 DIAGNOSIS — K2100 Gastro-esophageal reflux disease with esophagitis, without bleeding: Secondary | ICD-10-CM

## 2015-08-24 DIAGNOSIS — O9989 Other specified diseases and conditions complicating pregnancy, childbirth and the puerperium: Secondary | ICD-10-CM

## 2015-08-24 LAB — POCT URINALYSIS DIPSTICK
Bilirubin, UA: NEGATIVE
Glucose, UA: NEGATIVE
Ketones, UA: NEGATIVE
LEUKOCYTES UA: NEGATIVE
Nitrite, UA: NEGATIVE
PROTEIN UA: NEGATIVE
Spec Grav, UA: 1.015
UROBILINOGEN UA: NEGATIVE
pH, UA: 7

## 2015-08-24 MED ORDER — DOCUSATE SODIUM 100 MG PO CAPS: 100.0000 mg | ORAL_CAPSULE | Freq: Two times a day (BID) | ORAL | Status: DC | PRN

## 2015-08-24 MED ORDER — RANITIDINE HCL 150 MG PO TABS: 150.0000 mg | ORAL_TABLET | Freq: Two times a day (BID) | ORAL | Status: DC

## 2015-08-24 MED ORDER — PROMETHAZINE HCL 25 MG PO TABS: 25.0000 mg | ORAL_TABLET | Freq: Four times a day (QID) | ORAL | Status: DC | PRN

## 2015-08-24 MED ORDER — BUDESONIDE-FORMOTEROL FUMARATE 160-4.5 MCG/ACT IN AERO: 2.0000 | INHALATION_SPRAY | Freq: Two times a day (BID) | RESPIRATORY_TRACT | Status: DC

## 2015-08-24 MED ORDER — ONDANSETRON 4 MG PO TBDP: 4.0000 mg | ORAL_TABLET | Freq: Four times a day (QID) | ORAL | Status: DC | PRN

## 2015-08-24 MED ORDER — ACETAMINOPHEN-CODEINE #3 300-30 MG PO TABS: 1.0000 | ORAL_TABLET | Freq: Four times a day (QID) | ORAL | Status: DC | PRN

## 2015-08-24 NOTE — Progress Notes (Signed)
ROB: Patient still notes nausea/vomiting.  Desires refill on Phenergan (takes at night) due to drowsiness and Zofran (takes during day).  Also notes mild constipation, will prescribe Colace. Reports headaches, had h/o headaches prior to pregnancy.  Normally consumes ~ 1 cup of caffeinated beverage daily, takes Tylenol ES.  Can take Excedrin occasionally.  Also notes occasional pelvic pain flares not for controlled with OTC Tylenol.  Notes possible endometriosis (however undiagnosed due to at time of scheduling diagnostic laparoscopy, patient was noted to be pregnant).  Can prescribe T#3 for stronger pain. Needs albuterol switched to Symbicort. Has abnormal pap (LGSIL), will need colposcopy postpartum. For msAFP by next visit. For aatomy scan in 3 weeks, 4 week ROB visit.

## 2015-09-09 ENCOUNTER — Ambulatory Visit (INDEPENDENT_AMBULATORY_CARE_PROVIDER_SITE_OTHER): Payer: Medicaid Other

## 2015-09-09 DIAGNOSIS — Z3482 Encounter for supervision of other normal pregnancy, second trimester: Secondary | ICD-10-CM | POA: Diagnosis not present

## 2015-09-09 DIAGNOSIS — Z3492 Encounter for supervision of normal pregnancy, unspecified, second trimester: Secondary | ICD-10-CM

## 2015-09-15 ENCOUNTER — Other Ambulatory Visit: Payer: Self-pay | Admitting: Obstetrics and Gynecology

## 2015-09-16 ENCOUNTER — Other Ambulatory Visit: Payer: Self-pay | Admitting: Obstetrics and Gynecology

## 2015-09-20 ENCOUNTER — Other Ambulatory Visit: Payer: Self-pay

## 2015-09-20 DIAGNOSIS — J4520 Mild intermittent asthma, uncomplicated: Secondary | ICD-10-CM

## 2015-09-20 MED ORDER — BUDESONIDE-FORMOTEROL FUMARATE 80-4.5 MCG/ACT IN AERO: 2.0000 | INHALATION_SPRAY | Freq: Two times a day (BID) | RESPIRATORY_TRACT | Status: DC

## 2015-09-20 NOTE — Progress Notes (Signed)
[facility]

## 2015-09-21 ENCOUNTER — Encounter: Payer: Medicaid Other | Admitting: Obstetrics and Gynecology

## 2015-09-27 ENCOUNTER — Ambulatory Visit: Payer: Self-pay | Admitting: Family Medicine

## 2015-09-28 ENCOUNTER — Ambulatory Visit (INDEPENDENT_AMBULATORY_CARE_PROVIDER_SITE_OTHER): Payer: Medicaid Other | Admitting: Obstetrics and Gynecology

## 2015-09-28 VITALS — BP 97/59 | HR 91 | Wt 164.0 lb

## 2015-09-28 DIAGNOSIS — E663 Overweight: Secondary | ICD-10-CM | POA: Insufficient documentation

## 2015-09-28 DIAGNOSIS — Z3482 Encounter for supervision of other normal pregnancy, second trimester: Secondary | ICD-10-CM

## 2015-09-28 DIAGNOSIS — Z3492 Encounter for supervision of normal pregnancy, unspecified, second trimester: Secondary | ICD-10-CM

## 2015-09-28 DIAGNOSIS — Z3493 Encounter for supervision of normal pregnancy, unspecified, third trimester: Secondary | ICD-10-CM | POA: Insufficient documentation

## 2015-09-28 LAB — POCT URINALYSIS DIPSTICK
Bilirubin, UA: NEGATIVE
GLUCOSE UA: NEGATIVE
Ketones, UA: NEGATIVE
Leukocytes, UA: NEGATIVE
Nitrite, UA: NEGATIVE
RBC UA: NEGATIVE
SPEC GRAV UA: 1.015
UROBILINOGEN UA: NEGATIVE
pH, UA: 7

## 2015-09-28 MED ORDER — ONDANSETRON 4 MG PO TBDP: 4.0000 mg | ORAL_TABLET | Freq: Three times a day (TID) | ORAL | Status: DC | PRN

## 2015-09-28 NOTE — Progress Notes (Signed)
ROB: Still notes daily nausea, controlled with Zofran.  Tries to take Phenergan but makes her extremely drowsy.  Refill given on Zofran.  S/p normal anatomy scan.  Now out of range for msAFP (had rescheduled prior appointment). RTC in 4 weeks, for 28 week labs at that time.

## 2015-10-20 ENCOUNTER — Other Ambulatory Visit: Payer: Self-pay | Admitting: Obstetrics and Gynecology

## 2015-10-26 ENCOUNTER — Ambulatory Visit (INDEPENDENT_AMBULATORY_CARE_PROVIDER_SITE_OTHER): Payer: Medicaid Other | Admitting: Obstetrics and Gynecology

## 2015-10-26 ENCOUNTER — Other Ambulatory Visit: Payer: Medicaid Other

## 2015-10-26 VITALS — BP 95/60 | HR 98 | Wt 168.4 lb

## 2015-10-26 DIAGNOSIS — Z3492 Encounter for supervision of normal pregnancy, unspecified, second trimester: Secondary | ICD-10-CM

## 2015-10-26 DIAGNOSIS — Z23 Encounter for immunization: Secondary | ICD-10-CM

## 2015-10-26 DIAGNOSIS — Z3482 Encounter for supervision of other normal pregnancy, second trimester: Secondary | ICD-10-CM

## 2015-10-26 DIAGNOSIS — Z131 Encounter for screening for diabetes mellitus: Secondary | ICD-10-CM

## 2015-10-26 DIAGNOSIS — E663 Overweight: Secondary | ICD-10-CM

## 2015-10-26 LAB — POCT URINALYSIS DIPSTICK
Bilirubin, UA: NEGATIVE
Glucose, UA: NEGATIVE
KETONES UA: NEGATIVE
Leukocytes, UA: NEGATIVE
NITRITE UA: NEGATIVE
Protein, UA: NEGATIVE
SPEC GRAV UA: 1.015
UROBILINOGEN UA: NEGATIVE
pH, UA: 6.5

## 2015-10-26 MED ORDER — TETANUS-DIPHTH-ACELL PERTUSSIS 5-2.5-18.5 LF-MCG/0.5 IM SUSP
0.5000 mL | Freq: Once | INTRAMUSCULAR | Status: AC
  Administered 2015-10-26: 0.5 mL via INTRAMUSCULAR

## 2015-10-26 NOTE — Progress Notes (Signed)
ROB: Patient doing well, no complaints.  For 28 week labs today. Discussed cord blood banking. Tdap given today, blood consent signed.  Medicaid pregnancy home form completed today. RTC in 2-3 weeks.

## 2015-10-27 LAB — HEMOGLOBIN AND HEMATOCRIT, BLOOD
Hematocrit: 34.4 % (ref 34.0–46.6)
Hemoglobin: 11.6 g/dL (ref 11.1–15.9)

## 2015-10-27 LAB — GLUCOSE TOLERANCE, 1 HOUR: GLUCOSE, 1HR PP: 117 mg/dL (ref 65–199)

## 2015-11-09 ENCOUNTER — Encounter: Payer: Self-pay | Admitting: Obstetrics and Gynecology

## 2015-11-09 ENCOUNTER — Ambulatory Visit (INDEPENDENT_AMBULATORY_CARE_PROVIDER_SITE_OTHER): Payer: Medicaid Other | Admitting: Obstetrics and Gynecology

## 2015-11-09 VITALS — BP 108/70 | HR 91 | Wt 166.0 lb

## 2015-11-09 DIAGNOSIS — Z3493 Encounter for supervision of normal pregnancy, unspecified, third trimester: Secondary | ICD-10-CM

## 2015-11-09 DIAGNOSIS — Z720 Tobacco use: Secondary | ICD-10-CM

## 2015-11-09 LAB — POCT URINALYSIS DIPSTICK
Bilirubin, UA: NEGATIVE
GLUCOSE UA: NEGATIVE
Ketones, UA: NEGATIVE
Leukocytes, UA: NEGATIVE
Nitrite, UA: NEGATIVE
PH UA: 8
Protein, UA: NEGATIVE
RBC UA: NEGATIVE
SPEC GRAV UA: 1.01
Urobilinogen, UA: 0.2

## 2015-11-09 NOTE — Progress Notes (Signed)
Rob- no complaints. Glucola 117. Patient strongly encouraged to decrease tobacco use as well as Tylenol 3 for chronic pain.

## 2015-11-17 ENCOUNTER — Encounter: Payer: Medicaid Other | Admitting: Obstetrics and Gynecology

## 2015-11-30 ENCOUNTER — Encounter: Payer: Medicaid Other | Admitting: Obstetrics and Gynecology

## 2015-12-08 ENCOUNTER — Ambulatory Visit (INDEPENDENT_AMBULATORY_CARE_PROVIDER_SITE_OTHER): Payer: Medicaid Other | Admitting: Obstetrics and Gynecology

## 2015-12-08 VITALS — BP 100/65 | HR 76 | Wt 168.1 lb

## 2015-12-08 DIAGNOSIS — Z3493 Encounter for supervision of normal pregnancy, unspecified, third trimester: Secondary | ICD-10-CM

## 2015-12-08 DIAGNOSIS — Z3483 Encounter for supervision of other normal pregnancy, third trimester: Secondary | ICD-10-CM

## 2015-12-08 DIAGNOSIS — G2581 Restless legs syndrome: Secondary | ICD-10-CM

## 2015-12-08 DIAGNOSIS — K219 Gastro-esophageal reflux disease without esophagitis: Secondary | ICD-10-CM

## 2015-12-08 LAB — POCT URINALYSIS DIPSTICK
Bilirubin, UA: NEGATIVE
GLUCOSE UA: NEGATIVE
KETONES UA: NEGATIVE
Leukocytes, UA: NEGATIVE
Nitrite, UA: NEGATIVE
RBC UA: NEGATIVE
SPEC GRAV UA: 1.01
Urobilinogen, UA: 0.2
pH, UA: 8

## 2015-12-08 MED ORDER — PANTOPRAZOLE SODIUM 40 MG PO TBEC: 40.0000 mg | DELAYED_RELEASE_TABLET | Freq: Every day | ORAL | Status: DC

## 2015-12-08 NOTE — Progress Notes (Signed)
ROB: Patient notes complaints of restless leg syndrome, is drinking [employer] Dew before bed. Advised on cutting out caffeine before bed. Also encouraged to take iron or Vit B supplements. Patient also notes craving lots of ice.  Will repeat Hgb levels as this can be a sign of anemia. Also notes heartburn not relieved with Zantac.  Prescribed Protonix. RTC in 2 weeks.

## 2015-12-09 LAB — HEMOGLOBIN AND HEMATOCRIT, BLOOD
HEMOGLOBIN: 11.5 g/dL (ref 11.1–15.9)
Hematocrit: 33.9 % — ABNORMAL LOW (ref 34.0–46.6)

## 2015-12-22 ENCOUNTER — Ambulatory Visit (INDEPENDENT_AMBULATORY_CARE_PROVIDER_SITE_OTHER): Payer: Medicaid Other | Admitting: Obstetrics and Gynecology

## 2015-12-22 ENCOUNTER — Encounter: Payer: Self-pay | Admitting: Obstetrics and Gynecology

## 2015-12-22 VITALS — BP 120/70 | HR 100 | Wt 163.4 lb

## 2015-12-22 DIAGNOSIS — Z369 Encounter for antenatal screening, unspecified: Secondary | ICD-10-CM

## 2015-12-22 DIAGNOSIS — R809 Proteinuria, unspecified: Secondary | ICD-10-CM

## 2015-12-22 DIAGNOSIS — Z1389 Encounter for screening for other disorder: Secondary | ICD-10-CM

## 2015-12-22 DIAGNOSIS — O26849 Uterine size-date discrepancy, unspecified trimester: Secondary | ICD-10-CM

## 2015-12-22 DIAGNOSIS — Z36 Encounter for antenatal screening of mother: Secondary | ICD-10-CM

## 2015-12-22 DIAGNOSIS — O3660X0 Maternal care for excessive fetal growth, unspecified trimester, not applicable or unspecified: Secondary | ICD-10-CM

## 2015-12-22 DIAGNOSIS — Z720 Tobacco use: Secondary | ICD-10-CM

## 2015-12-22 DIAGNOSIS — Z3493 Encounter for supervision of normal pregnancy, unspecified, third trimester: Secondary | ICD-10-CM

## 2015-12-22 DIAGNOSIS — E663 Overweight: Secondary | ICD-10-CM

## 2015-12-22 LAB — POCT URINALYSIS DIPSTICK
Bilirubin, UA: NEGATIVE
Glucose, UA: NEGATIVE
KETONES UA: NEGATIVE
Leukocytes, UA: NEGATIVE
Nitrite, UA: NEGATIVE
PH UA: 6
RBC UA: NEGATIVE
SPEC GRAV UA: 1.01
UROBILINOGEN UA: NEGATIVE

## 2015-12-22 MED ORDER — DOUBLE ANTIBIOTIC 500-10000 UNIT/GM EX OINT: 1.0000 "application " | TOPICAL_OINTMENT | Freq: Two times a day (BID) | CUTANEOUS | Status: DC

## 2015-12-23 ENCOUNTER — Other Ambulatory Visit: Payer: Medicaid Other

## 2015-12-23 DIAGNOSIS — R809 Proteinuria, unspecified: Secondary | ICD-10-CM

## 2015-12-23 LAB — COMPREHENSIVE METABOLIC PANEL
A/G RATIO: 1.3 (ref 1.2–2.2)
ALT: 8 IU/L (ref 0–32)
AST: 16 IU/L (ref 0–40)
Albumin: 3.3 g/dL — ABNORMAL LOW (ref 3.5–5.5)
Alkaline Phosphatase: 146 IU/L — ABNORMAL HIGH (ref 39–117)
BILIRUBIN TOTAL: 0.3 mg/dL (ref 0.0–1.2)
BUN/Creatinine Ratio: 8 — ABNORMAL LOW (ref 9–23)
BUN: 5 mg/dL — ABNORMAL LOW (ref 6–20)
CALCIUM: 8.6 mg/dL — AB (ref 8.7–10.2)
CHLORIDE: 103 mmol/L (ref 96–106)
CO2: 21 mmol/L (ref 18–29)
Creatinine, Ser: 0.59 mg/dL (ref 0.57–1.00)
GFR calc Af Amer: 142 mL/min/{1.73_m2} (ref >59)
GFR, EST NON AFRICAN AMERICAN: 123 mL/min/{1.73_m2} (ref >59)
GLUCOSE: 93 mg/dL (ref 65–99)
Globulin, Total: 2.6 g/dL (ref 1.5–4.5)
POTASSIUM: 3.6 mmol/L (ref 3.5–5.2)
Sodium: 139 mmol/L (ref 134–144)
Total Protein: 5.9 g/dL — ABNORMAL LOW (ref 6.0–8.5)

## 2015-12-23 LAB — CBC
Hematocrit: 37.9 % (ref 34.0–46.6)
Hemoglobin: 13 g/dL (ref 11.1–15.9)
MCH: 29.7 pg (ref 26.6–33.0)
MCHC: 34.3 g/dL (ref 31.5–35.7)
MCV: 87 fL (ref 79–97)
Platelets: 172 10*3/uL (ref 150–379)
RBC: 4.38 x10E6/uL (ref 3.77–5.28)
RDW: 13.5 % (ref 12.3–15.4)
WBC: 9.6 10*3/uL (ref 3.4–10.8)

## 2015-12-23 LAB — URIC ACID: Uric Acid: 5.2 mg/dL (ref 2.5–7.1)

## 2015-12-23 LAB — PROTEIN / CREATININE RATIO, URINE
CREATININE, UR: 342.7 mg/dL
PROTEIN/CREAT RATIO: 298 mg/g{creat} — AB (ref 0–200)
Protein, Ur: 102 mg/dL

## 2015-12-24 NOTE — Progress Notes (Signed)
ROB: Reports swelling in hands and feet. Exam notes no edema.  New proteinuria in pregnancy, BPs wnl (however elevated for patient).  Will order PIH labs. Also notes back pain x 1 week, mild.  Takes Tylenol. 36 week labs done today. Size less than dates again today, will order sono for growth. PTL precautions/fetal kick count recs given. Continue to encourage smoking cessation. RTC in 1week.

## 2015-12-28 ENCOUNTER — Ambulatory Visit (INDEPENDENT_AMBULATORY_CARE_PROVIDER_SITE_OTHER): Payer: Medicaid Other | Admitting: Obstetrics and Gynecology

## 2015-12-28 ENCOUNTER — Ambulatory Visit (INDEPENDENT_AMBULATORY_CARE_PROVIDER_SITE_OTHER): Payer: Medicaid Other

## 2015-12-28 ENCOUNTER — Inpatient Hospital Stay
Admission: EM | Admit: 2015-12-28 | Discharge: 2015-12-30 | DRG: 774 | Disposition: A | Discharge status: Home / Self Care | Payer: Medicaid Other | Attending: Obstetrics and Gynecology | Admitting: Obstetrics and Gynecology

## 2015-12-28 ENCOUNTER — Encounter
Admission: EM | Disposition: A | Discharge status: Home / Self Care | Payer: Self-pay | Source: Home / Self Care | Attending: Obstetrics and Gynecology

## 2015-12-28 ENCOUNTER — Encounter: Payer: Self-pay | Admitting: Anesthesiology

## 2015-12-28 DIAGNOSIS — O4590 Premature separation of placenta, unspecified, unspecified trimester: Secondary | ICD-10-CM

## 2015-12-28 DIAGNOSIS — G43909 Migraine, unspecified, not intractable, without status migrainosus: Secondary | ICD-10-CM | POA: Diagnosis present

## 2015-12-28 DIAGNOSIS — IMO0002 Reserved for concepts with insufficient information to code with codable children: Secondary | ICD-10-CM

## 2015-12-28 DIAGNOSIS — F17210 Nicotine dependence, cigarettes, uncomplicated: Secondary | ICD-10-CM | POA: Diagnosis present

## 2015-12-28 DIAGNOSIS — O4103X0 Oligohydramnios, third trimester, not applicable or unspecified: Secondary | ICD-10-CM | POA: Diagnosis present

## 2015-12-28 DIAGNOSIS — O365930 Maternal care for other known or suspected poor fetal growth, third trimester, not applicable or unspecified: Principal | ICD-10-CM | POA: Diagnosis present

## 2015-12-28 DIAGNOSIS — O9952 Diseases of the respiratory system complicating childbirth: Secondary | ICD-10-CM | POA: Diagnosis present

## 2015-12-28 DIAGNOSIS — K219 Gastro-esophageal reflux disease without esophagitis: Secondary | ICD-10-CM | POA: Diagnosis present

## 2015-12-28 DIAGNOSIS — O4593 Premature separation of placenta, unspecified, third trimester: Secondary | ICD-10-CM | POA: Diagnosis present

## 2015-12-28 DIAGNOSIS — O9962 Diseases of the digestive system complicating childbirth: Secondary | ICD-10-CM | POA: Diagnosis present

## 2015-12-28 DIAGNOSIS — Z3493 Encounter for supervision of normal pregnancy, unspecified, third trimester: Secondary | ICD-10-CM | POA: Diagnosis not present

## 2015-12-28 DIAGNOSIS — J45909 Unspecified asthma, uncomplicated: Secondary | ICD-10-CM | POA: Diagnosis present

## 2015-12-28 DIAGNOSIS — Z3A35 35 weeks gestation of pregnancy: Secondary | ICD-10-CM

## 2015-12-28 DIAGNOSIS — O26849 Uterine size-date discrepancy, unspecified trimester: Secondary | ICD-10-CM

## 2015-12-28 DIAGNOSIS — O1490 Unspecified pre-eclampsia, unspecified trimester: Secondary | ICD-10-CM

## 2015-12-28 DIAGNOSIS — O99334 Smoking (tobacco) complicating childbirth: Secondary | ICD-10-CM | POA: Diagnosis present

## 2015-12-28 DIAGNOSIS — Z720 Tobacco use: Secondary | ICD-10-CM

## 2015-12-28 DIAGNOSIS — O3660X0 Maternal care for excessive fetal growth, unspecified trimester, not applicable or unspecified: Secondary | ICD-10-CM | POA: Diagnosis not present

## 2015-12-28 LAB — COMPREHENSIVE METABOLIC PANEL
ALBUMIN: 2.9 g/dL — AB (ref 3.5–5.0)
ALT: 11 U/L — ABNORMAL LOW (ref 14–54)
AST: 21 U/L (ref 15–41)
Alkaline Phosphatase: 150 U/L — ABNORMAL HIGH (ref 38–126)
Anion gap: 8 (ref 5–15)
CHLORIDE: 109 mmol/L (ref 101–111)
CO2: 20 mmol/L — ABNORMAL LOW (ref 22–32)
Calcium: 8.4 mg/dL — ABNORMAL LOW (ref 8.9–10.3)
Creatinine, Ser: 0.49 mg/dL (ref 0.44–1.00)
GFR calc Af Amer: >60 mL/min (ref >60)
GFR calc non Af Amer: >60 mL/min (ref >60)
GLUCOSE: 78 mg/dL (ref 65–99)
POTASSIUM: 3.5 mmol/L (ref 3.5–5.1)
SODIUM: 137 mmol/L (ref 135–145)
Total Bilirubin: 0.5 mg/dL (ref 0.3–1.2)
Total Protein: 6.1 g/dL — ABNORMAL LOW (ref 6.5–8.1)

## 2015-12-28 LAB — CBC
HCT: 36.8 % (ref 35.0–47.0)
Hemoglobin: 12.9 g/dL (ref 12.0–16.0)
MCH: 30 pg (ref 26.0–34.0)
MCHC: 35 g/dL (ref 32.0–36.0)
MCV: 85.6 fL (ref 80.0–100.0)
PLATELETS: 171 10*3/uL (ref 150–440)
RBC: 4.3 MIL/uL (ref 3.80–5.20)
RDW: 13.5 % (ref 11.5–14.5)
WBC: 12 10*3/uL — AB (ref 3.6–11.0)

## 2015-12-28 LAB — PROTEIN / CREATININE RATIO, URINE
Creatinine, Urine: 212 mg/dL
Protein Creatinine Ratio: 0.24 mg/mg{creat} — ABNORMAL HIGH (ref 0.00–0.15)
Total Protein, Urine: 51 mg/dL

## 2015-12-28 LAB — URIC ACID: URIC ACID, SERUM: 6 mg/dL (ref 2.3–6.6)

## 2015-12-28 SURGERY — Surgical Case
Anesthesia: Choice

## 2015-12-28 MED ORDER — FERROUS SULFATE 325 (65 FE) MG PO TABS
325.0000 mg | ORAL_TABLET | Freq: Two times a day (BID) | ORAL | Status: DC
  Administered 2015-12-29 – 2015-12-30 (×2): 325 mg via ORAL
  Filled 2015-12-28 (×2): qty 1

## 2015-12-28 MED ORDER — AMMONIA AROMATIC IN INHA
RESPIRATORY_TRACT | Status: AC
  Filled 2015-12-28: qty 10

## 2015-12-28 MED ORDER — OXYCODONE-ACETAMINOPHEN 5-325 MG PO TABS: 1.0000 | ORAL_TABLET | ORAL | Status: DC | PRN

## 2015-12-28 MED ORDER — OXYCODONE-ACETAMINOPHEN 5-325 MG PO TABS: 2.0000 | ORAL_TABLET | ORAL | Status: DC | PRN

## 2015-12-28 MED ORDER — LIDOCAINE HCL (PF) 1 % IJ SOLN: 30.0000 mL | INTRAMUSCULAR | Status: DC | PRN

## 2015-12-28 MED ORDER — OXYTOCIN 40 UNITS IN LACTATED RINGERS INFUSION - SIMPLE MED
2.5000 [IU]/h | INTRAVENOUS | Status: DC
  Administered 2015-12-28: 2.5 [IU]/h via INTRAVENOUS
  Filled 2015-12-28: qty 1000

## 2015-12-28 MED ORDER — ONDANSETRON HCL 4 MG PO TABS: 4.0000 mg | ORAL_TABLET | ORAL | Status: DC | PRN

## 2015-12-28 MED ORDER — TETANUS-DIPHTH-ACELL PERTUSSIS 5-2.5-18.5 LF-MCG/0.5 IM SUSP: 0.5000 mL | INTRAMUSCULAR | Status: DC | PRN

## 2015-12-28 MED ORDER — SOD CITRATE-CITRIC ACID 500-334 MG/5ML PO SOLN: 30.0000 mL | ORAL | Status: DC | PRN

## 2015-12-28 MED ORDER — OXYTOCIN 10 UNIT/ML IJ SOLN
INTRAMUSCULAR | Status: AC
  Filled 2015-12-28: qty 2

## 2015-12-28 MED ORDER — VANCOMYCIN HCL IN DEXTROSE 1-5 GM/200ML-% IV SOLN
1000.0000 mg | Freq: Two times a day (BID) | INTRAVENOUS | Status: DC
  Administered 2015-12-28: 1000 mg via INTRAVENOUS
  Filled 2015-12-28 (×4): qty 200

## 2015-12-28 MED ORDER — OXYTOCIN 40 UNITS IN LACTATED RINGERS INFUSION - SIMPLE MED
1.0000 m[IU]/min | INTRAVENOUS | Status: DC
  Administered 2015-12-28: 2 m[IU]/min via INTRAVENOUS

## 2015-12-28 MED ORDER — DOCUSATE SODIUM 100 MG PO CAPS
100.0000 mg | ORAL_CAPSULE | Freq: Two times a day (BID) | ORAL | Status: DC
  Administered 2015-12-28 – 2015-12-30 (×4): 100 mg via ORAL
  Filled 2015-12-28 (×4): qty 1

## 2015-12-28 MED ORDER — OXYTOCIN 40 UNITS IN LACTATED RINGERS INFUSION - SIMPLE MED
2.5000 [IU]/h | INTRAVENOUS | Status: DC | PRN
  Filled 2015-12-28: qty 1000

## 2015-12-28 MED ORDER — FAMOTIDINE 20 MG PO TABS
20.0000 mg | ORAL_TABLET | Freq: Two times a day (BID) | ORAL | Status: DC
  Administered 2015-12-28 – 2015-12-30 (×5): 20 mg via ORAL
  Filled 2015-12-28 (×5): qty 1

## 2015-12-28 MED ORDER — FENTANYL CITRATE (PF) 100 MCG/2ML IJ SOLN
50.0000 ug | INTRAMUSCULAR | Status: DC | PRN
Start: 2015-12-28 — End: 2015-12-29
  Administered 2015-12-28: 50 ug via INTRAVENOUS
  Filled 2015-12-28: qty 2

## 2015-12-28 MED ORDER — ACETAMINOPHEN 325 MG PO TABS: 650.0000 mg | ORAL_TABLET | ORAL | Status: DC | PRN

## 2015-12-28 MED ORDER — DIBUCAINE 1 % RE OINT: 1.0000 "application " | TOPICAL_OINTMENT | RECTAL | Status: DC | PRN

## 2015-12-28 MED ORDER — WITCH HAZEL-GLYCERIN EX PADS: 1.0000 "application " | MEDICATED_PAD | CUTANEOUS | Status: DC | PRN

## 2015-12-28 MED ORDER — OXYTOCIN BOLUS FROM INFUSION: 500.0000 mL | INTRAVENOUS | Status: DC

## 2015-12-28 MED ORDER — LACTATED RINGERS IV SOLN
INTRAVENOUS | Status: DC
  Administered 2015-12-28: 11:00:00 via INTRAVENOUS

## 2015-12-28 MED ORDER — LIDOCAINE HCL (PF) 1 % IJ SOLN
INTRAMUSCULAR | Status: AC
Start: 2015-12-28 — End: 2015-12-29
  Filled 2015-12-28: qty 30

## 2015-12-28 MED ORDER — DIPHENHYDRAMINE HCL 25 MG PO CAPS: 25.0000 mg | ORAL_CAPSULE | Freq: Four times a day (QID) | ORAL | Status: DC | PRN

## 2015-12-28 MED ORDER — ONDANSETRON HCL 4 MG/2ML IJ SOLN: 4.0000 mg | INTRAMUSCULAR | Status: DC | PRN

## 2015-12-28 MED ORDER — ONDANSETRON HCL 4 MG/2ML IJ SOLN: 4.0000 mg | Freq: Four times a day (QID) | INTRAMUSCULAR | Status: DC | PRN

## 2015-12-28 MED ORDER — NICOTINE 14 MG/24HR TD PT24
14.0000 mg | MEDICATED_PATCH | Freq: Every day | TRANSDERMAL | Status: DC
  Administered 2015-12-28: 14 mg via TRANSDERMAL
  Filled 2015-12-28 (×3): qty 1

## 2015-12-28 MED ORDER — IBUPROFEN 800 MG PO TABS
800.0000 mg | ORAL_TABLET | Freq: Three times a day (TID) | ORAL | Status: DC
  Administered 2015-12-28 – 2015-12-30 (×5): 800 mg via ORAL
  Filled 2015-12-28 (×6): qty 1

## 2015-12-28 MED ORDER — COCONUT OIL OIL: 1.0000 "application " | TOPICAL_OIL | Status: DC | PRN

## 2015-12-28 MED ORDER — SIMETHICONE 80 MG PO CHEW
80.0000 mg | CHEWABLE_TABLET | ORAL | Status: DC | PRN
Start: 2015-12-28 — End: 2015-12-30
  Administered 2015-12-29: 80 mg via ORAL
  Filled 2015-12-28: qty 1

## 2015-12-28 MED ORDER — LACTATED RINGERS IV SOLN: 500.0000 mL | INTRAVENOUS | Status: DC | PRN

## 2015-12-28 MED ORDER — BENZOCAINE-MENTHOL 20-0.5 % EX AERO: 1.0000 "application " | INHALATION_SPRAY | CUTANEOUS | Status: DC | PRN

## 2015-12-28 MED ORDER — MISOPROSTOL 200 MCG PO TABS
ORAL_TABLET | ORAL | Status: AC
Start: 2015-12-28 — End: 2015-12-29
  Filled 2015-12-28: qty 4

## 2015-12-28 MED ORDER — PRENATAL MULTIVITAMIN CH
1.0000 | ORAL_TABLET | Freq: Every day | ORAL | Status: DC
  Administered 2015-12-29: 1 via ORAL
  Filled 2015-12-28: qty 1

## 2015-12-28 MED ORDER — TERBUTALINE SULFATE 1 MG/ML IJ SOLN: 0.2500 mg | Freq: Once | INTRAMUSCULAR | Status: DC | PRN

## 2015-12-28 MED ORDER — MEASLES, MUMPS & RUBELLA VAC ~~LOC~~ INJ
0.5000 mL | INJECTION | Freq: Once | SUBCUTANEOUS | Status: DC
  Filled 2015-12-28: qty 0.5

## 2015-12-28 SURGICAL SUPPLY — 21 items
CANISTER SUCT 3000ML (MISCELLANEOUS) IMPLANT
CHLORAPREP W/TINT 26ML (MISCELLANEOUS) IMPLANT
DRSG TELFA 3X8 NADH (GAUZE/BANDAGES/DRESSINGS) IMPLANT
ELECT REM PT RETURN 9FT ADLT (ELECTROSURGICAL) IMPLANT
ELECTRODE REM PT RTRN 9FT ADLT (ELECTROSURGICAL) IMPLANT
GAUZE SPONGE 4X4 12PLY STRL (GAUZE/BANDAGES/DRESSINGS) IMPLANT
GLOVE BIO SURGEON STRL SZ8 (GLOVE) IMPLANT
GOWN STRL REUS W/ TWL LRG LVL3 (GOWN DISPOSABLE) IMPLANT
GOWN STRL REUS W/ TWL XL LVL3 (GOWN DISPOSABLE) IMPLANT
GOWN STRL REUS W/TWL LRG LVL3 (GOWN DISPOSABLE) IMPLANT
GOWN STRL REUS W/TWL XL LVL3 (GOWN DISPOSABLE) IMPLANT
NS IRRIG 1000ML POUR BTL (IV SOLUTION) IMPLANT
PACK C SECTION AR (MISCELLANEOUS) IMPLANT
PAD OB MATERNITY 4.3X12.25 (PERSONAL CARE ITEMS) IMPLANT
PAD PREP 24X41 OB/GYN DISP (PERSONAL CARE ITEMS) IMPLANT
STRAP SAFETY BODY (MISCELLANEOUS) IMPLANT
SUT CHROMIC 1-0 (SUTURE) IMPLANT
SUT MAXON ABS #0 GS21 30IN (SUTURE) IMPLANT
SUT PLAIN GUT 0 (SUTURE) IMPLANT
SUT VIC AB 2-0 CT1 27 (SUTURE) IMPLANT
SUT VIC AB 2-0 CT1 TAPERPNT 27 (SUTURE) IMPLANT

## 2015-12-28 NOTE — Progress Notes (Signed)
Dr. Greggory KeeneFrancesco calling stat c/s at [id number] for suspected abruption following AROM/IUPC and fetal decelerations. Dr. Pernell DupreAdams called at 1812, OR called at 1812, pt signed consents for c/s and prepped to move to OR.

## 2015-12-28 NOTE — H&P (Signed)
Brittney Carpenter is a 30 y.o. female presenting for Pitocin induction of labor for oligohydramnios and IUGR identified at 35.[redacted] weeks gestation on ultrasound today. Patient reports good fetal movement. She is not experiencing preeclampsia symptoms. Lab work last week was notable for a urine protein to creatinine ratio of 298. Other PIH labs were normal  History OB History    Gravida Para Term Preterm AB TAB SAB Ectopic Multiple Living   4 3 3       3      Past Medical History  Diagnosis Date  . Chicken pox   . Seasonal allergies   . Asthma   . Depression   . Frequent headaches   . GERD (gastroesophageal reflux disease)   . Migraine    Past Surgical History  Procedure Laterality Date  . Tonsillectomy     Family History: family history includes Alcohol abuse in her father and mother; Cancer in her mother; Colon polyps in her mother; Diabetes in her maternal grandmother; Uterine cancer in her maternal aunt; Vascular Disease in her father. There is no history of Heart disease. Social History:  reports that she has been smoking Cigarettes.  She has been smoking about 0.50 packs per day. She has never used smokeless tobacco. She reports that she drinks alcohol. She reports that she does not use illicit drugs.   Prenatal Transfer Tool  Maternal Diabetes: No Genetic Screening: Normal Maternal Ultrasounds/Referrals: Normal Fetal Ultrasounds or other Referrals:  None Maternal Substance Abuse:  Yes:  Type: Smoker Significant Maternal Medications:  None Significant Maternal Lab Results:  None Other Comments:  None  ROS  No PIH sxs. Good FM     Last menstrual period 03/19/2015. Exam Physical Exam  LMP 03/19/2015 (Approximate) EFW: 5#5 Cx: 1.5/50/-3/firm/vertex Edema: Negative  Prenatal labs: ABO, Rh: A/Positive/-- (12/30 1200) Antibody: Negative (12/30 1200) Rubella: <20.0 (12/30 1200) RPR: Non Reactive (12/30 1200)  HBsAg: Negative (12/30 1200)  HIV: Non Reactive (12/30 1200)   GBS:   Unknown  CBC Latest Ref Rng 12/28/2015 12/23/2015 12/08/2015  WBC 3.6 - 11.0 K/uL 12.0(H) 9.6 -  Hemoglobin 12.0 - 16.0 g/dL 65.712.9 - -  Hematocrit 84.635.0 - 47.0 % 36.8 37.9 33.9(L)  Platelets 150 - 440 K/uL 171 172 -   CMP Latest Ref Rng 12/28/2015 12/23/2015 03/29/2015  Glucose 65 - 99 mg/dL 78 93 95  BUN 6 - 20 mg/dL <9(G<5(L) 5(L) 8  Creatinine 0.44 - 1.00 mg/dL 2.950.49 2.840.59 1.320.84  Sodium 135 - 145 mmol/L 137 139 142  Potassium 3.5 - 5.1 mmol/L 3.5 3.6 4.2  Chloride 101 - 111 mmol/L 109 103 103  CO2 22 - 32 mmol/L 20(L) 21 25  Calcium 8.9 - 10.3 mg/dL 4.4(W8.4(L) 1.0(U8.6(L) 9.1  Total Protein 6.5 - 8.1 g/dL 6.1(L) 5.9(L) 6.6  Total Bilirubin 0.3 - 1.2 mg/dL 0.5 0.3 0.3  Alkaline Phos 38 - 126 U/L 150(H) 146(H) 81  AST 15 - 41 U/L 21 16 21   ALT 14 - 54 U/L 11(L) 8 25     Assessment/Plan: 1. 35.5 week intrauterine pregnancy 2. IUGR (less than 3rd percentile growth) 3. Oligohydramnios (less than 1 cm on 4 quadrant AFI)  Pitocin IOL; GBS prophylaxis  Daphine DeutscherMartin A Everitt Wenner 12/28/2015, 10:29 AM

## 2015-12-28 NOTE — Patient Instructions (Signed)
Patient is sent to labor and delivery for induction of labor

## 2015-12-28 NOTE — Progress Notes (Signed)
Chief complaint: 1. Oligohydramnios 2. IUGR 3. Tobacco user  Patient presented for ultrasound today and had abnormal findings which have prompted induction of labor and delivery of this patient at 35.[redacted] weeks gestation. (See the hospital admission H&P for details).  Herold HarmsMartin A Ayla Dunigan, MD  Note: This dictation was prepared with Dragon dictation along with smaller phrase technology. Any transcriptional errors that result from this process are unintentional.

## 2015-12-28 NOTE — H&P (Signed)
Brittney Carpenter is a 30 y.o. female presenting for Pitocin induction of labor for oligohydramnios and IUGR identified at 35.[redacted] weeks gestation on ultrasound today. Patient reports good fetal movement. She is not experiencing preeclampsia symptoms. Lab work last week was notable for a urine protein to creatinine ratio of 298. Other PIH labs were normal  History OB History    Gravida Para Term Preterm AB TAB SAB Ectopic Multiple Living   4 3 3       3      Past Medical History  Diagnosis Date  . Chicken pox   . Seasonal allergies   . Asthma   . Depression   . Frequent headaches   . GERD (gastroesophageal reflux disease)   . Migraine    Past Surgical History  Procedure Laterality Date  . Tonsillectomy     Family History: family history includes Alcohol abuse in her father and mother; Cancer in her mother; Colon polyps in her mother; Diabetes in her maternal grandmother; Uterine cancer in her maternal aunt; Vascular Disease in her father. There is no history of Heart disease. Social History:  reports that she has been smoking Cigarettes.  She has been smoking about 1.00 pack per day. She has never used smokeless tobacco. She reports that she does not drink alcohol or use illicit drugs.   Prenatal Transfer Tool  Maternal Diabetes: No Genetic Screening: Normal Maternal Ultrasounds/Referrals: Normal Fetal Ultrasounds or other Referrals:  None Maternal Substance Abuse:  Yes:  Type: Smoker Significant Maternal Medications:  None Significant Maternal Lab Results:  None Other Comments:  None  ROS  No PIH sxs Good FM  BP 123/106 mmHg  Pulse 61  Temp(Src) 97.8 F (36.6 C) (Oral)  Resp 20  Ht 5\' 1"  (1.549 m)  Wt 163 lb (73.936 kg)  BMI 30.81 kg/m2  LMP 03/19/2015 (Approximate) EFW: 5#5  Dilation: 1.5 Effacement (%): 50 Exam by:: Dr Greggory Keenefrancesco Blood pressure 123/106, pulse 61, temperature 97.8 F (36.6 C), temperature source Oral, resp. rate 20, height 5\' 1"  (1.549 m), weight  163 lb (73.936 kg), last menstrual period 03/19/2015. Exam Physical Exam  Prenatal labs: ABO, Rh: A/Positive/-- (12/30 1200) Antibody: Negative (12/30 1200) Rubella: <20.0 (12/30 1200) RPR: Non Reactive (12/30 1200)  HBsAg: Negative (12/30 1200)  HIV: Non Reactive (12/30 1200)  GBS:     Assessment/Plan: 1. 35.5 week intrauterine pregnancy 2. IUGR (less than 3rd percentile growth) 3. Oligohydramnios (less than 1 cm on 4 quadrant AFI) 4. Tobacco user  Pitocin IOL; GBS prophylaxis for unknown status, PCN allergy  Brittney Carpenter 12/28/2015, 12:58 PM

## 2015-12-29 ENCOUNTER — Encounter: Payer: Medicaid Other | Admitting: Obstetrics and Gynecology

## 2015-12-29 LAB — CBC
HCT: 32.7 % — ABNORMAL LOW (ref 35.0–47.0)
Hemoglobin: 11.5 g/dL — ABNORMAL LOW (ref 12.0–16.0)
MCH: 30.6 pg (ref 26.0–34.0)
MCHC: 35.2 g/dL (ref 32.0–36.0)
MCV: 86.9 fL (ref 80.0–100.0)
PLATELETS: 145 10*3/uL — AB (ref 150–440)
RBC: 3.76 MIL/uL — AB (ref 3.80–5.20)
RDW: 14 % (ref 11.5–14.5)
WBC: 13.8 10*3/uL — AB (ref 3.6–11.0)

## 2015-12-29 LAB — RPR: RPR: NONREACTIVE

## 2015-12-29 NOTE — Progress Notes (Signed)
Post Partum Day 1 Subjective: no complaints, up ad lib, voiding and tolerating PO  Objective: Blood pressure 124/75, pulse 67, temperature 98.3 F (36.8 C), temperature source Oral, resp. rate 18, height 5\' 1"  (1.549 m), weight 163 lb (73.936 kg), last menstrual period 03/19/2015, unknown if currently breastfeeding.  Physical Exam:  General: alert, cooperative and no distress Lochia: appropriate Uterine Fundus: firm Incision: NA DVT Evaluation: Negative Homan's sign.   Recent Labs  12/28/15 1051 12/29/15 0529  HGB 12.9 11.5*  HCT 36.8 32.7*    Assessment/Plan: Plan for discharge tomorrow  S/P IOL for IUGR/Oligo complicated by Abruptio Placenta; H/H stable   LOS: 1 day   Prentice DockerMartin A Aquila Carpenter 12/29/2015, 7:22 AM

## 2015-12-30 DIAGNOSIS — O4590 Premature separation of placenta, unspecified, unspecified trimester: Secondary | ICD-10-CM

## 2015-12-30 LAB — SURGICAL PATHOLOGY

## 2015-12-30 MED ORDER — NICOTINE 14 MG/24HR TD PT24: 14.0000 mg | MEDICATED_PATCH | Freq: Every day | TRANSDERMAL | Status: DC

## 2015-12-30 MED ORDER — IBUPROFEN 800 MG PO TABS: 800.0000 mg | ORAL_TABLET | Freq: Three times a day (TID) | ORAL | Status: DC

## 2015-12-30 MED ORDER — FERROUS SULFATE 325 (65 FE) MG PO TABS: 325.0000 mg | ORAL_TABLET | Freq: Two times a day (BID) | ORAL | Status: DC

## 2015-12-30 NOTE — Progress Notes (Signed)
D/C order from MD.  Reviewed d/c instructions and prescriptions with patient and answered any questions.  Patient will d/c home later today because infant remains in SCN.

## 2015-12-30 NOTE — Discharge Summary (Signed)
Physician Obstetric Discharge Summary  Patient ID: Brittney Carpenter MRN: [id number] DOB/AGE: June 17, 1986 30 y.o.   Date of Admission: 12/28/2015  Date of Discharge:   Admitting Diagnosis: Induction of labor and IUGR; oligohydramnios; 35.5 week intrauterine pregnancy; tobacco user at [redacted]w[redacted]d  Secondary Diagnosis: IUGR; oligohydramnios; 35.5 week intrauterine pregnancy; tobacco user; viable female  Mode of Delivery: normal spontaneous vaginal delivery     Discharge Diagnosis: IUGR; oligohydramnios; 35.5 week intrauterine pregnancy; tobacco user; viable female; abruptio placentae   Intrapartum Procedures: Atificial rupture of membranes, pitocin augmentation, placement of fetal scalp electrode and placement of intrauterine catheter   Post partum procedures: None  Complications: Abruptio placentae Precipitated rapid delivery   Brief Hospital Course  Brittney Carpenter is a [id number] who had a SVD on 12/28/2015;  for further details of this surgery, please refer to the delivey note.  Patient had an uncomplicated postpartum course.  By time of discharge on PPD#2, her pain was controlled on oral pain medications; she had appropriate lochia and was ambulating, voiding without difficulty and tolerating regular diet.  She was deemed stable for discharge to home.   Infant remained in NICU due to feeding issues associated with prematurity.    Labs: CBC Latest Ref Rng 12/29/2015 12/28/2015 12/23/2015  WBC 3.6 - 11.0 K/uL 13.8(H) 12.0(H) 9.6  Hemoglobin 12.0 - 16.0 g/dL 11.5(L) 12.9 -  Hematocrit 35.0 - 47.0 % 32.7(L) 36.8 37.9  Platelets 150 - 440 K/uL 145(L) 171 172   A  Physical exam:  Blood pressure 108/58, pulse 68, temperature 98.9 F (37.2 C), temperature source Oral, resp. rate 20, height  (1.549 m), weight 163 lb (73.936 kg), last menstrual period 03/19/2015, SpO2 100 %, unknown if currently breastfeeding. General: alert and no distress Lochia: appropriate Abdomen: soft, NT Uterine Fundus:  firm Extremities: No evidence of DVT seen on physical exam. No lower extremity edema.  Discharge Instructions: Per After Visit Summary. Activity: Advance as tolerated. Pelvic rest for 6 weeks.  Also refer to After Visit Summary Diet: Regular Medications:   Medication List    STOP taking these medications        acetaminophen-codeine 300-30 MG tablet  Commonly known as:  TYLENOL #3     ondansetron 4 MG disintegrating tablet  Commonly known as:  ZOFRAN-ODT     promethazine 25 MG tablet  Commonly known as:  PHENERGAN     ranitidine 150 MG tablet  Commonly known as:  ZANTAC      TAKE these medications        acetaminophen 325 MG tablet  Commonly known as:  TYLENOL  Take 650 mg by mouth.     budesonide-formoterol 80-4.5 MCG/ACT inhaler  Commonly known as:  SYMBICORT  Inhale 2 puffs into the lungs 2 (two) times daily.     CONCEPT DHA 53.5-38-1 MG Caps  Take 1 tablet by mouth daily.     docusate sodium 100 MG capsule  Commonly known as:  COLACE  Take 1 capsule (100 mg total) by mouth 2 (two) times daily as needed.     ferrous sulfate 325 (65 FE) MG tablet  Take 1 tablet (325 mg total) by mouth 2 (two) times daily with a meal.     ibuprofen 800 MG tablet  Commonly known as:  ADVIL,MOTRIN  Take 1 tablet (800 mg total) by mouth 3 (three) times daily.     nicotine 14 mg/24hr patch  Commonly known as:  NICODERM CQ - dosed in mg/24 hours  Place 1  patch (14 mg total) onto the skin daily.     pantoprazole 40 MG tablet  Commonly known as:  PROTONIX  Take 1 tablet (40 mg total) by mouth daily.     polymixin-bacitracin 500-10000 UNIT/GM Oint ointment  Apply 1 application topically 2 (two) times daily.       Outpatient follow up:      Follow-up Information    Follow up with Hildred LaserAnika Cherry, MD. Schedule an appointment as soon as possible for a visit in 6 weeks.   Specialties:  Obstetrics and Gynecology, Radiology   Why:  6 week Post Partum Check   Contact information:    [address] [place] [address] [phone]      Postpartum contraception: IUD  Discharged Condition: good  Discharged to: home   Newborn Data: Disposition:NICU  Apgars: APGAR (1 MIN): 7   APGAR (5 MINS): 9   APGAR (10 MINS):    Baby Feeding: Bottle and Breast  Brittney HarmsMartin A Winnona Wargo, MD

## 2015-12-30 NOTE — Discharge Instructions (Signed)
Please call your doctor or return to the ER if you experience any chest pains, shortness of breath, fever greater than 101, any heavy bleeding or large clots, and foul smelling vaginal discharge, any worsening abdominal pain & cramping that is not controlled by pain medication, or any signs of post partum depression.  No tampons, enemas, douches, or sexual intercourse for 6 weeks.  Also avoid tub baths, hot tubs, or swimming for 6 weeks. ° ° ° °Postpartum Care After Vaginal Delivery °After you deliver your newborn (postpartum period), the usual stay in the hospital is 24-72 hours. If there were problems with your labor or delivery, or if you have other medical problems, you might be in the hospital longer.  °While you are in the hospital, you will receive help and instructions on how to care for yourself and your newborn during the postpartum period.  °While you are in the hospital: °· Be sure to tell your nurses if you have pain or discomfort, as well as where you feel the pain and what makes the pain worse. °· If you had an incision made near your vagina (episiotomy) or if you had some tearing during delivery, the nurses may put ice packs on your episiotomy or tear. The ice packs may help to reduce the pain and swelling. °· If you are breastfeeding, you may feel uncomfortable contractions of your uterus for a couple of weeks. This is normal. The contractions help your uterus get back to normal size. °· It is normal to have some bleeding after delivery. °¨ For the first 1-3 days after delivery, the flow is red and the amount may be similar to a period. °¨ It is common for the flow to start and stop. °¨ In the first few days, you may pass some small clots. Let your nurses know if you begin to pass large clots or your flow increases. °¨ Do not  flush blood clots down the toilet before having the nurse look at them. °¨ During the next 3-10 days after delivery, your flow should become more watery and pink or  brown-tinged in color. °¨ Ten to fourteen days after delivery, your flow should be a small amount of yellowish-white discharge. °¨ The amount of your flow will decrease over the first few weeks after delivery. Your flow may stop in 6-8 weeks. Most women have had their flow stop by 12 weeks after delivery. °· You should change your sanitary pads frequently. °· Wash your hands thoroughly with soap and water for at least 20 seconds after changing pads, using the toilet, or before holding or feeding your newborn. °· You should feel like you need to empty your bladder within the first 6-8 hours after delivery. °· In case you become weak, lightheaded, or faint, call your nurse before you get out of bed for the first time and before you take a shower for the first time. °· Within the first few days after delivery, your breasts may begin to feel tender and full. This is called engorgement. Breast tenderness usually goes away within 48-72 hours after engorgement occurs. You may also notice milk leaking from your breasts. If you are not breastfeeding, do not stimulate your breasts. Breast stimulation can make your breasts produce more milk. °· Spending as much time as possible with your newborn is very important. During this time, you and your newborn can feel close and get to know each other. Having your newborn stay in your room (rooming in) will help to strengthen   the bond with your newborn.  It will give you time to get to know your newborn and become comfortable caring for your newborn. °· Your hormones change after delivery. Sometimes the hormone changes can temporarily cause you to feel sad or tearful. These feelings should not last more than a few days. If these feelings last longer than that, you should talk to your caregiver. °· If desired, talk to your caregiver about methods of family planning or contraception. °· Talk to your caregiver about immunizations. Your caregiver may want you to have the following  immunizations before leaving the hospital: °¨ Tetanus, diphtheria, and pertussis (Tdap) or tetanus and diphtheria (Td) immunization. It is very important that you and your family (including grandparents) or others caring for your newborn are up-to-date with the Tdap or Td immunizations. The Tdap or Td immunization can help protect your newborn from getting ill. °¨ Rubella immunization. °¨ Varicella (chickenpox) immunization. °¨ Influenza immunization. You should receive this annual immunization if you did not receive the immunization during your pregnancy. °  °This information is not intended to replace advice given to you by your health care provider. Make sure you discuss any questions you have with your health care provider. °  °Document Released: 04/01/2007 Document Revised: 02/27/2012 Document Reviewed: 01/30/2012 °Elsevier Interactive Patient Education ©2016 Elsevier Inc. ° °

## 2016-01-05 ENCOUNTER — Encounter: Payer: Medicaid Other | Admitting: Obstetrics and Gynecology

## 2016-01-12 ENCOUNTER — Encounter: Payer: Medicaid Other | Admitting: Obstetrics and Gynecology

## 2016-01-19 ENCOUNTER — Encounter: Payer: Medicaid Other | Admitting: Obstetrics and Gynecology

## 2016-01-26 ENCOUNTER — Encounter: Payer: Medicaid Other | Admitting: Obstetrics and Gynecology

## 2016-02-09 ENCOUNTER — Encounter: Payer: Self-pay | Admitting: Obstetrics and Gynecology

## 2016-02-09 ENCOUNTER — Ambulatory Visit (INDEPENDENT_AMBULATORY_CARE_PROVIDER_SITE_OTHER): Payer: Medicaid Other | Admitting: Obstetrics and Gynecology

## 2016-02-09 DIAGNOSIS — F53 Postpartum depression: Secondary | ICD-10-CM

## 2016-02-09 DIAGNOSIS — F329 Major depressive disorder, single episode, unspecified: Secondary | ICD-10-CM

## 2016-02-09 DIAGNOSIS — Z87898 Personal history of other specified conditions: Secondary | ICD-10-CM

## 2016-02-09 DIAGNOSIS — O99345 Other mental disorders complicating the puerperium: Secondary | ICD-10-CM

## 2016-02-09 DIAGNOSIS — F418 Other specified anxiety disorders: Secondary | ICD-10-CM

## 2016-02-09 DIAGNOSIS — R87612 Low grade squamous intraepithelial lesion on cytologic smear of cervix (LGSIL): Secondary | ICD-10-CM

## 2016-02-09 DIAGNOSIS — F419 Anxiety disorder, unspecified: Secondary | ICD-10-CM

## 2016-02-09 MED ORDER — VENLAFAXINE HCL ER 75 MG PO CP24
75.0000 mg | ORAL_CAPSULE | Freq: Every day | ORAL | 3 refills | Status: DC
Start: 2016-02-09 — End: 2016-06-28

## 2016-02-09 NOTE — Progress Notes (Signed)
   OBSTETRICS POSTPARTUM CLINIC PROGRESS NOTE  Subjective:     Brittney Carpenter is a 30 y.o. [phone] female who presents for a postpartum visit. She is 6 weeks postpartum following a spontaneous vaginal delivery. I have fully reviewed the prenatal and intrapartum course.  The pregnancy was complicated by severe oligohydramnios and IUGR. The labor was complicated by  placental abruption. The delivery was at 35.5 gestational weeks.  Anesthesia: none. Postpartum course has been well. Baby's course has been well. 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 Bleeding: patient has/has not resumed menses, with Patient's last menstrual period was 01/31/2016.Marland Kitchen. Bowel function is normal. Bladder function is normal. Patient is not sexually active. Contraception method desired is Mirena IUD. Postpartum depression screening: positive (PHQ-9 score is 11).  The following portions of the patient's history were reviewed and updated as appropriate: allergies, current medications, past family history, past medical history, past social history, past surgical history and problem list.  Review of Systems Pertinent items noted in HPI and remainder of comprehensive ROS otherwise negative.   Objective:    BP 114/66   Pulse 92   Ht 5\' 1"  (1.549 m)   Wt 152 lb 14.4 oz (69.4 kg)   LMP 01/31/2016   Breastfeeding? No   BMI 28.89 kg/m   General:  alert and no distress   Breasts:  inspection negative, no nipple discharge or bleeding, no masses or nodularity palpable  Lungs: clear to auscultation bilaterally  Heart:  regular rate and rhythm, S1, S2 normal, no murmur, click, rub or gallop  Abdomen: soft, non-tender; bowel sounds normal; no masses,  no organomegaly.    Vulva:  normal  Vagina: normal vagina, no discharge, exudate, lesion, or erythema  Cervix:  no cervical motion tenderness and no lesions  Corpus: normal size, contour, position, consistency, mobility, non-tender  Adnexa:  normal adnexa and no mass, fullness,  tenderness  Rectal Exam: Not performed.         Labs:  Lab Results  Component Value Date   HGB 11.5 (L) 12/29/2015     Assessment:   Routine postpartum exam.    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 Contraception: desires Mirena IUD 2.  Postpartum depression. Patient notes that she has been on Zoloft before but did not work well. Also comments that she did not like psychotherapy.  Will prescribe Effexor 25 mg daily.  Will titrate as needed. To f/u symptoms at next visit.  3. H/o abnormal pap smear.  Will have patient scheduled for colposcopy with Dr. Greggory KeeneFrancesco (primary GYN prior to pregnancy) in next 2-3 weeks.  Patient also wonders if she can have IUD placed at that time.  Can assess after colposcopy performed if IUD placement is possible at same visit.   Patient given samples of contraceptive pills (Taytulla) until next visit, in case IUD not placed.  4. H/o chronic pelvic pain.  Patient was scheduled to undergo diagnostic laparoscopy to assess for endometriosis prior to onset of pregnancy.  Will refer back to Dr. Greggory KeeneFrancesco for further evaluation.    Hildred LaserAnika Magic Mohler, MD [facility]

## 2016-02-10 DIAGNOSIS — R87612 Low grade squamous intraepithelial lesion on cytologic smear of cervix (LGSIL): Secondary | ICD-10-CM | POA: Insufficient documentation

## 2016-02-12 ENCOUNTER — Encounter: Payer: Self-pay | Admitting: Obstetrics and Gynecology

## 2016-03-13 ENCOUNTER — Ambulatory Visit (INDEPENDENT_AMBULATORY_CARE_PROVIDER_SITE_OTHER): Payer: Medicaid Other | Admitting: Obstetrics and Gynecology

## 2016-03-13 ENCOUNTER — Encounter: Payer: Self-pay | Admitting: Obstetrics and Gynecology

## 2016-03-13 VITALS — BP 105/68 | HR 71 | Ht 60.0 in | Wt 151.7 lb

## 2016-03-13 DIAGNOSIS — Z3041 Encounter for surveillance of contraceptive pills: Secondary | ICD-10-CM

## 2016-03-13 DIAGNOSIS — R896 Abnormal cytological findings in specimens from other organs, systems and tissues: Secondary | ICD-10-CM | POA: Diagnosis not present

## 2016-03-13 DIAGNOSIS — B958 Unspecified staphylococcus as the cause of diseases classified elsewhere: Secondary | ICD-10-CM | POA: Diagnosis not present

## 2016-03-13 DIAGNOSIS — IMO0002 Reserved for concepts with insufficient information to code with codable children: Secondary | ICD-10-CM

## 2016-03-13 DIAGNOSIS — L089 Local infection of the skin and subcutaneous tissue, unspecified: Secondary | ICD-10-CM

## 2016-03-13 DIAGNOSIS — N946 Dysmenorrhea, unspecified: Secondary | ICD-10-CM | POA: Diagnosis not present

## 2016-03-13 LAB — POCT URINE PREGNANCY: Preg Test, Ur: NEGATIVE

## 2016-03-13 MED ORDER — NORETHIN ACE-ETH ESTRAD-FE 1-20 MG-MCG(24) PO CAPS: 20.0000 mg | ORAL_CAPSULE | Freq: Every day | ORAL | 3 refills | Status: AC

## 2016-03-13 MED ORDER — SULFAMETHOXAZOLE-TRIMETHOPRIM 800-160 MG PO TABS: 1.0000 | ORAL_TABLET | Freq: Two times a day (BID) | ORAL | 0 refills | Status: DC

## 2016-03-13 NOTE — Patient Instructions (Addendum)
1. Pap smear is done today along with colposcopy (no biopsies) 2. Birth control pill refill is given 3. Follow-up in 3 months regarding dysmenorrhea 4. Follow-up in 6 months for repeat colposcopy/Pap smear 5. Septra DS twice a day for possible staph cellulitis

## 2016-03-13 NOTE — Progress Notes (Signed)
Chief complaint: 1. OCP refill 2. 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 Dysmenorrhea  Brittney Carpenter presents for colposcopy for abnormal Pap smear evaluation. Pregnancy Pap smear is notable for LGSIL/positive Pap smear. She is now 2 months postpartum and here for her colposcopy.  Patient is requesting refill of birth control pills and would like to remain on the pill for contraception instead of proceeding with IUD insertion.  Patient has concerns about endometriosis given her past clinical history and has inquired about laparoscopy for confirmation. Long history of dysmenorrhea, deep thrusting dyspareunia and chronic pelvic pain.  Past Medical History:  Diagnosis Date  . Asthma   . Chicken pox   . Depression   . Frequent headaches   . GERD (gastroesophageal reflux disease)   . Migraine   . Seasonal allergies    Past Surgical History:  Procedure Laterality Date  . TONSILLECTOMY     OBJECTIVE: BP 105/68   Pulse 71   Ht 5' (1.524 m)   Wt 151 lb 11.2 oz (68.8 kg)   LMP 02/28/2016 (Approximate)   Breastfeeding? No   BMI 29.63 kg/m  Pleasant female in no acute distress Pelvic exam: External genitalia-normal BUS Normal Vagina normal Cervix-no gross lesions Bimanual exam-not performed today Skin-multiple folliculitis lesions on abdomen back and thigh  PROCEDURE: Colposcopy of upper adjacent vagina and cervix  Indications-LGSIL/positive Pap smear   Findings-adequate colposcopy; no lesions  Biopsies-Pap smear only  ASSESSMENT: 1. LGSIL/positive Pap smear 06/2015 2. Adequate colposcopy with no evidence of disease 3. History of chronic pelvic pain, dysmenorrhea, and dyspareunia 4. OCP refill to help with symptoms and contraception 5. Return in 3 months for follow-up on pelvic pain 6. Return in 6 months for colposcopy/Pap smear  A total of 15 minutes were spent face-to-face with the patient during this encounter and over half of that time dealt with  counseling and coordination of care.  Herold HarmsMartin A Messina Kosinski, MD  Note: This dictation was prepared with Dragon dictation along with smaller phrase technology. Any transcriptional errors that result from this process are unintentional.

## 2016-03-15 ENCOUNTER — Encounter: Payer: Self-pay | Admitting: Obstetrics and Gynecology

## 2016-03-22 LAB — PAP IG AND HPV HIGH-RISK: PAP Smear Comment: 0

## 2016-03-22 LAB — HPV, LOW VOLUME (REFLEX)

## 2016-06-05 ENCOUNTER — Ambulatory Visit (INDEPENDENT_AMBULATORY_CARE_PROVIDER_SITE_OTHER): Payer: Medicaid Other | Admitting: Obstetrics and Gynecology

## 2016-06-05 ENCOUNTER — Encounter: Payer: Self-pay | Admitting: Obstetrics and Gynecology

## 2016-06-05 VITALS — BP 109/73 | HR 97 | Ht 60.0 in | Wt 161.5 lb

## 2016-06-05 DIAGNOSIS — N946 Dysmenorrhea, unspecified: Secondary | ICD-10-CM | POA: Diagnosis not present

## 2016-06-05 DIAGNOSIS — Z3041 Encounter for surveillance of contraceptive pills: Secondary | ICD-10-CM

## 2016-06-05 DIAGNOSIS — Z720 Tobacco use: Secondary | ICD-10-CM | POA: Diagnosis not present

## 2016-06-05 DIAGNOSIS — Z87898 Personal history of other specified conditions: Secondary | ICD-10-CM | POA: Diagnosis not present

## 2016-06-05 NOTE — Progress Notes (Signed)
Chief complaint: 1.Dysmenorrhea 2. Contraceptive management 3. LGSIL Pap smear 4. Tobacco user  Patient presents for follow-up on dysmenorrhea. She started on oral contraceptives 3 months ago. She is having minimal spotting at the end of her cycle. Cramps are still moderate on a scale of 7-8 out of 10. She is continuing to experience deep thrusting dyspareunia. She is willing to proceed with laparoscopy to verify endometriosis and treat it surgically. Patient has history of LGSIL Pap smear-scheduled for colposcopy in March 2018  OBJECTIVE: BP 109/73   Pulse 97   Ht 5' (1.524 m)   Wt 161 lb 8 oz (73.3 kg)   LMP 05/15/2016 (Exact Date)   BMI 31.54 kg/m  Physical exam-deferred  ASSESSMENT: 1. Dysmenorrhea and dyspareunia, persists despite OCP therapy 2. LGSIL Pap smear, needing colposcopy 3. Tobacco user  PLAN: 1. Colposcopy is scheduled for March 2018 2. Patient is scheduled for laparoscopy with peritoneal biopsies to rule out endometriosis-and of January 2018 3. Return for preop appointment the week before surgery 4. Smoking cessation encouraged  A total of 15 minutes were spent face-to-face with the patient during this encounter and over half of that time dealt with counseling and coordination of care.  Herold HarmsMartin A Eriverto Byrnes, MD  Note: This dictation was prepared with Dragon dictation along with smaller phrase technology. Any transcriptional errors that result from this process are unintentional.

## 2016-06-05 NOTE — Patient Instructions (Addendum)
1. Return in March 2018 for colposcopy 2.laparoscopy with biopsies is scheduled for and of January 2018 to rule out endometriosis 3. Return in 1 week before surgery for preoperative appointment  Diagnostic Laparoscopy A diagnostic laparoscopy is a procedure to diagnose diseases in the abdomen. During the procedure, a thin, lighted, pencil-sized instrument called a laparoscope is inserted into the abdomen through an incision. The laparoscope allows your health care provider to look at the organs inside your body. Tell a health care provider about:  Any allergies you have.  All medicines you are taking, including vitamins, herbs, eye drops, creams, and over-the-counter medicines.  Any problems you or family members have had with anesthetic medicines.  Any blood disorders you have.  Any surgeries you have had.  Any medical conditions you have. What are the risks? Generally, this is a safe procedure. However, problems can occur, which may include:  Infection.  Bleeding.  Damage to other organs.  Allergic reaction to the anesthetics used during the procedure. What happens before the procedure?  Do not eat or drink anything after midnight on the night before the procedure or as directed by your health care provider.  Ask your health care provider about:  Changing or stopping your regular medicines.  Taking medicines such as aspirin and ibuprofen. These medicines can thin your blood. Do not take these medicines before your procedure if your health care provider instructs you not to.  Plan to have someone take you home after the procedure. What happens during the procedure?  You may be given a medicine to help you relax (sedative).  You will be given a medicine to make you sleep (general anesthetic).  Your abdomen will be inflated with a gas. This will make your organs easier to see.  Small incisions will be made in your abdomen.  A laparoscope and other small instruments  will be inserted into the abdomen through the incisions.  A tissue sample may be removed from an organ in the abdomen for examination.  The instruments will be removed from the abdomen.  The gas will be released.  The incisions will be closed with stitches (sutures). What happens after the procedure? Your blood pressure, heart rate, breathing rate, and blood oxygen level will be monitored often until the medicines you were given have worn off. This information is not intended to replace advice given to you by your health care provider. Make sure you discuss any questions you have with your health care provider. Document Released: 09/10/2000 Document Revised: 10/13/2015 Document Reviewed: 01/15/2014 Elsevier Interactive Patient Education  2017 [employer]

## 2016-06-28 ENCOUNTER — Other Ambulatory Visit: Payer: Self-pay | Admitting: Obstetrics and Gynecology

## 2016-07-09 ENCOUNTER — Other Ambulatory Visit: Payer: Medicaid Other

## 2016-07-11 ENCOUNTER — Ambulatory Visit (INDEPENDENT_AMBULATORY_CARE_PROVIDER_SITE_OTHER): Payer: Medicaid Other | Admitting: Obstetrics and Gynecology

## 2016-07-11 ENCOUNTER — Encounter: Payer: Self-pay | Admitting: Obstetrics and Gynecology

## 2016-07-11 ENCOUNTER — Encounter
Admission: RE | Admit: 2016-07-11 | Discharge: 2016-07-11 | Disposition: A | Discharge status: Home / Self Care | Payer: Medicaid Other | Source: Ambulatory Visit | Attending: Obstetrics and Gynecology | Admitting: Obstetrics and Gynecology

## 2016-07-11 VITALS — BP 120/82 | HR 75 | Ht 60.0 in | Wt 162.2 lb

## 2016-07-11 DIAGNOSIS — Z01818 Encounter for other preprocedural examination: Secondary | ICD-10-CM | POA: Diagnosis not present

## 2016-07-11 DIAGNOSIS — Z87898 Personal history of other specified conditions: Secondary | ICD-10-CM

## 2016-07-11 DIAGNOSIS — Z720 Tobacco use: Secondary | ICD-10-CM

## 2016-07-11 DIAGNOSIS — N946 Dysmenorrhea, unspecified: Secondary | ICD-10-CM

## 2016-07-11 HISTORY — DX: Anemia, unspecified: D64.9

## 2016-07-11 HISTORY — DX: Anxiety disorder, unspecified: F41.9

## 2016-07-11 HISTORY — DX: Papillomavirus as the cause of diseases classified elsewhere: B97.7

## 2016-07-11 LAB — CBC WITH DIFFERENTIAL/PLATELET
Basophils Absolute: 0 10*3/uL (ref 0–0.1)
Basophils Relative: 0 %
EOS ABS: 0.2 10*3/uL (ref 0–0.7)
Eosinophils Relative: 2 %
HCT: 44.2 % (ref 35.0–47.0)
HEMOGLOBIN: 15.1 g/dL (ref 12.0–16.0)
LYMPHS ABS: 2.5 10*3/uL (ref 1.0–3.6)
LYMPHS PCT: 26 %
MCH: 31.4 pg (ref 26.0–34.0)
MCHC: 34.3 g/dL (ref 32.0–36.0)
MCV: 91.5 fL (ref 80.0–100.0)
MONOS PCT: 6 %
Monocytes Absolute: 0.6 10*3/uL (ref 0.2–0.9)
NEUTROS PCT: 66 %
Neutro Abs: 6.3 10*3/uL (ref 1.4–6.5)
Platelets: 217 10*3/uL (ref 150–440)
RBC: 4.83 MIL/uL (ref 3.80–5.20)
RDW: 13.1 % (ref 11.5–14.5)
WBC: 9.7 10*3/uL (ref 3.6–11.0)

## 2016-07-11 LAB — RAPID HIV SCREEN (HIV 1/2 AB+AG)
HIV 1/2 Antibodies: NONREACTIVE
HIV-1 P24 ANTIGEN - HIV24: NONREACTIVE

## 2016-07-11 NOTE — Progress Notes (Signed)
PRE-OPERATIVE HISTORY AND PHYSICAL EXAM  PCP:  James Hawkins Jr, MD  HPI:  Brittney Carpenter is a 30 y.o. G4P3104.  Patient's last menstrual period was 06/11/2016 (approximate).  She presents today for a pre-op discussion and PE.  She has the following symptoms:  Chronic pelvic pain, significant dysmenorrhea/dyspareunia.  Failure of OCPs.  ROS: Constitutional: Denied constitutional symptoms, night sweats, recent illness, fatigue, fever, insomnia and weight loss.  Eyes: Denied eye symptoms, eye pain, photophobia, vision change and visual disturbance.  Ears/Nose/Throat/Neck: Denied ear, nose, throat or neck symptoms, hearing loss, nasal discharge, sinus congestion and sore throat.  Cardiovascular: Denied cardiovascular symptoms, arrhythmia, chest pain/pressure, edema, exercise intolerance, orthopnea and palpitations.  Respiratory: Denied pulmonary symptoms, asthma, pleuritic pain, productive sputum, cough, dyspnea and wheezing.  Gastrointestinal: Denied, gastro-esophageal reflux, melena, nausea and vomiting.  Genitourinary: See HPI for additional information.  Musculoskeletal: Denied musculoskeletal symptoms, stiffness, swelling, muscle weakness and myalgia.  Dermatologic: Denied dermatology symptoms, rash and scar.  Neurologic: Denied neurology symptoms, dizziness, headache, neck pain and syncope.  Psychiatric: Denied psychiatric symptoms, anxiety and depression.  Endocrine: Denied endocrine symptoms including hot flashes and night sweats.    OB History  Gravida Para Term Preterm AB Living  4 4 3 1   4  SAB TAB Ectopic Multiple Live Births        0 4    # Outcome Date GA Lbr Len/2nd Weight Sex Delivery Anes PTL Lv  4 Preterm 12/28/15 [redacted]w[redacted]d / 00:01 4 lb 4.4 oz (1.94 kg) M Vag-Spont None  LIV  3 Term 2011   7 lb (3.175 kg) M Vag-Spont   LIV  2 Term 2006   6 lb 14.4 oz (3.13 kg) F Vag-Spont   LIV  1 Term 2004   6 lb 1.6 oz (2.767 kg) F Vag-Spont   LIV      Past Medical History:   Diagnosis Date  . Asthma   . Chicken pox   . Depression   . Dysmenorrhea   . Frequent headaches   . GERD (gastroesophageal reflux disease)   . Migraine   . Seasonal allergies     Past Surgical History:  Procedure Laterality Date  . TONSILLECTOMY        SOCIAL HISTORY:  History  Smoking Status  . Current Every Day Smoker  . Packs/day: 1.00  . Types: Cigarettes  Smokeless Tobacco  . Never Used   History  Alcohol Use No    Comment: RARE    History  Drug Use No    Family History  Problem Relation Age of Onset  . Alcohol abuse Mother   . Colon polyps Mother   . Cancer Mother     Cervical  . Alcohol abuse Father   . Vascular Disease Father   . Uterine cancer Maternal Aunt   . Diabetes Maternal Grandmother   . Heart disease Neg Hx     ALLERGIES:  Watermelon [citrullus vulgaris]; Penicillins; and Vicodin [hydrocodone-acetaminophen]  Current Outpatient Prescriptions on File Prior to Visit  Medication Sig Dispense Refill  . Aspirin-Acetaminophen-Caffeine (GOODYS EXTRA STRENGTH) 500-325-65 MG PACK Take 1 packet by mouth every 8 (eight) hours as needed (for pain/headache.).    . Aspirin-Salicylamide-Caffeine (BC FAST PAIN RELIEF) 650-195-33.3 MG PACK Take 1 packet by mouth every 8 (eight) hours as needed (for headache/pain.).    . budesonide-formoterol (SYMBICORT) 80-4.5 MCG/ACT inhaler Inhale 2 puffs into the lungs 2 (two) times daily. (Patient taking differently: Inhale 2 puffs into the lungs at bedtime. )   1 Inhaler 12  . Norethin Ace-Eth Estrad-FE (TAYTULLA) 1-20 MG-MCG(24) CAPS Take 20 mg by mouth daily. (Patient taking differently: Take 20 mg by mouth daily at 6 PM. ) 90 capsule 3  . ondansetron (ZOFRAN-ODT) 4 MG disintegrating tablet Take 4 mg by mouth every 8 (eight) hours as needed (for motion sickness.).     . ranitidine (ZANTAC) 150 MG tablet Take 150 mg by mouth daily as needed (for heartburn/acid reflux.).     . venlafaxine XR (EFFEXOR-XR) 75 MG 24 hr  capsule TAKE 1 CAPSULE (75 MG TOTAL) BY MOUTH DAILY. (Patient taking differently: Take 75 mg by mouth daily at 6 PM. ) 30 capsule 0   No current facility-administered medications on file prior to visit.    No orders of the defined types were placed in this encounter.    Physical examination BP 120/82   Pulse 75   Ht 5' (1.524 m)   Wt 162 lb 3 oz (73.6 kg)   LMP 06/11/2016 (Approximate)   BMI 31.68 kg/m   Physical examination General NAD, Conversant  HEENT Atraumatic; Op clear with mmm.  Normo-cephalic. Pupils reactive. Anicteric sclerae  Thyroid/Neck Smooth without nodularity or enlargement. Normal ROM.  Neck Supple.  Skin No rashes, lesions or ulceration. Normal palpated skin turgor. No nodularity.  Breasts: No masses or discharge.  Symmetric.  No axillary adenopathy.  Lungs: Clear to auscultation.No rales or wheezes. Normal Respiratory effort, no retractions.  Heart: NSR.  No murmurs or rubs appreciated. No periferal edema  Abdomen: Soft.  Non-tender.  No masses.  No HSM. No hernia  Extremities: Moves all appropriately.  Normal ROM for age. No lymphadenopathy.  Neuro: Oriented to PPT.  Normal mood. Normal affect.     Pelvic:   Vulva: Normal appearance.  No lesions.  Vagina: No lesions or abnormalities noted.  Support: Normal pelvic support.  Urethra No masses tenderness or scarring.  Meatus Normal size without lesions or prolapse.  Cervix: Normal ectropion.  No lesions.  Anus: Normal exam.  No lesions.  Perineum: Normal exam.  No lesions.        Bimanual   Uterus: Normal size.  Non-tender.  Mobile.  AV.  Adnexae: No masses.  Non-tender to palpation.  Cul-de-sac: Negative for abnormality.    Assessment: 1. Dysmenorrhea   2. Preop examination   3. History of chronic pain   4. Tobacco user        PLAN: 1.  laparoscopy diagnostic/operative  Pre-op discussions regarding Risks and Benefits of her scheduled surgery.  LPY for Pain We have discussed the procedure  of Exploratory Laparoscopy in detail.  Medical management of pelvic pain has also been discussed and the patient has decided that this option is not satisfactory for her.  She has elected to have a Laparoscopy performed to attempt to diagnose and manage her pain.  I have informed her that Laparoscopy, like other surgical procedures, entails the following risks:  bleeding, infection, damage to bowel, bladder or other internal organ, and the risk or anesthesia.  She is aware that her risks are not limited to these.  We have discussed the possibility of Endometriosis and Pelvic Adhesive Disease.  We have discussed surgical as well as subsequent medical management of these conditions.  The patient is aware that both of these conditions can often be diagnosed and treated via Laparoscopy.  We have discussed the possibility  of recurrence and that despite adequate treatment at this time, pelvic pain may reoccur in the future.    We have discussed the possibility that Laparoscopy may not diagnose or cure her pain.  It is possible that no diagnosis will be made in up to 50% of cases.  I have answered all of her questions and I believe she has been well informed regarding the risks/benefits of Exploratory Laparoscopy for pelvic pain.    Saba Gomm J. Najia Hurlbutt, M.D. 07/11/2016 10:45 AM   

## 2016-07-11 NOTE — Progress Notes (Signed)
Pt is a 31 yo here for a preop on 07/16/16 with Dr Greggory KeeneFrancesco for Laproscopy diagnostic with biopsies for pelvic pain. Pt states she has not had any labwork recently.

## 2016-07-11 NOTE — Patient Instructions (Signed)
  Your procedure is scheduled on: July 16, 2016 (Monday) Report to Same Day Surgery 2nd floor medical mall [facility](Medical Mall Entrance-take elevator on left to 2nd floor.  Check in with surgery information desk.) To find out your arrival time please call [phone] between 1PM - 3PM on July 13, 2016 (Friday)  Remember: Instructions that are not followed completely may result in serious medical risk, up to and including death, or upon the discretion of your surgeon and anesthesiologist your surgery may need to be rescheduled.    _x___ 1. Do not eat food or drink liquids after midnight. No gum chewing or hard candies.     __x__ 2. No Alcohol for 24 hours before or after surgery.   __x__3. No Smoking for 24 prior to surgery.   ____  4. Bring all medications with you on the day of surgery if instructed.    __x__ 5. Notify your doctor if there is any change in your medical condition     (cold, fever, infections).     Do not wear jewelry, make-up, hairpins, clips or nail polish.  Do not wear lotions, powders, or perfumes. You may wear deodorant.  Do not shave 48 hours prior to surgery. Men may shave face and neck.  Do not bring valuables to the hospital.    [facility] is not responsible for any belongings or valuables.               Contacts, dentures or bridgework may not be worn into surgery.  Leave your suitcase in the car. After surgery it may be brought to your room.  For patients admitted to the hospital, discharge time is determined by your treatment team.   Patients discharged the day of surgery will not be allowed to drive home.  You will need someone to drive you home and stay with you the night of your procedure.    Please read over the following fact sheets that you were given:   [facility] and or MRSA Information   _x___ Take these medicines the morning of surgery with A SIP OF WATER:    1. Ranitidine (Ranitidine at bedtime Sunday night prior to  surgery)  2.  3.  4.  5.  6.  ____Fleets enema or Magnesium Citrate as directed.   _x___ Use CHG Soap or sage wipes as directed on instruction sheet   _x___ Use inhalers on the day of surgery and bring to hospital day of surgery (Use Symbicort inhaler the morning of surgery and bring to hospital)  ____ Stop metformin 2 days prior to surgery    ____ Take 1/2 of usual insulin dose the night before surgery and none on the morning of           surgery.   __x__ Stop Aspirin, Coumadin, Pllavix ,Eliquis, Effient, or Pradaxa (NO ASPIRIN)   x__ Stop Anti-inflammatories such as Advil, Aleve, Ibuprofen, Motrin, Naproxen,          Naprosyn, Goodies powders or aspirin products NOW .      Ok to take Tylenol.   ____ Stop supplements until after surgery.    ____ Bring C-Pap to the hospital.

## 2016-07-11 NOTE — H&P (Signed)
PRE-OPERATIVE HISTORY AND PHYSICAL EXAM  PCP:  Fidel Levy, MD  HPI:  Brittney Carpenter is a 31 y.o. [phone].  Patient's last menstrual period was 06/11/2016 (approximate).  She presents today for a pre-op discussion and PE.  She has the following symptoms:  Chronic pelvic pain, significant dysmenorrhea/dyspareunia.  Failure of OCPs.  ROS: Constitutional: Denied constitutional symptoms, night sweats, recent illness, fatigue, fever, insomnia and weight loss.  Eyes: Denied eye symptoms, eye pain, photophobia, vision change and visual disturbance.  Ears/Nose/Throat/Neck: Denied ear, nose, throat or neck symptoms, hearing loss, nasal discharge, sinus congestion and sore throat.  Cardiovascular: Denied cardiovascular symptoms, arrhythmia, chest pain/pressure, edema, exercise intolerance, orthopnea and palpitations.  Respiratory: Denied pulmonary symptoms, asthma, pleuritic pain, productive sputum, cough, dyspnea and wheezing.  Gastrointestinal: Denied, gastro-esophageal reflux, melena, nausea and vomiting.  Genitourinary: See HPI for additional information.  Musculoskeletal: Denied musculoskeletal symptoms, stiffness, swelling, muscle weakness and myalgia.  Dermatologic: Denied dermatology symptoms, rash and scar.  Neurologic: Denied neurology symptoms, dizziness, headache, neck pain and syncope.  Psychiatric: Denied psychiatric symptoms, anxiety and depression.  Endocrine: Denied endocrine symptoms including hot flashes and night sweats.    OB History  Gravida Para Term Preterm AB Living  4 4 3 1   4   SAB TAB Ectopic Multiple Live Births        0 4    # Outcome Date GA Lbr Len/2nd Weight Sex Delivery Anes PTL Lv  4 Preterm 12/28/15 105w6d / 00:01 4 lb 4.4 oz (1.94 kg) M Vag-Spont None  LIV  3 Term 2011   7 lb (3.175 kg) M Vag-Spont   LIV  2 Term 2006   6 lb 14.4 oz (3.13 kg) F Vag-Spont   LIV  1 Term 2004   6 lb 1.6 oz (2.767 kg) F Vag-Spont   LIV      Past Medical History:   Diagnosis Date  . Asthma   . Chicken pox   . Depression   . Dysmenorrhea   . Frequent headaches   . GERD (gastroesophageal reflux disease)   . Migraine   . Seasonal allergies     Past Surgical History:  Procedure Laterality Date  . TONSILLECTOMY        SOCIAL HISTORY:  History  Smoking Status  . Current Every Day Smoker  . Packs/day: 1.00  . Types: Cigarettes  Smokeless Tobacco  . Never Used   History  Alcohol Use No    Comment: RARE    History  Drug Use No    Family History  Problem Relation Age of Onset  . Alcohol abuse Mother   . Colon polyps Mother   . Cancer Mother     Cervical  . Alcohol abuse Father   . Vascular Disease Father   . Uterine cancer Maternal Aunt   . Diabetes Maternal Grandmother   . Heart disease Neg Hx     ALLERGIES:  Watermelon [citrullus vulgaris]; Penicillins; and Vicodin [hydrocodone-acetaminophen]  Current Outpatient Prescriptions on File Prior to Visit  Medication Sig Dispense Refill  . Aspirin-Acetaminophen-Caffeine (GOODYS EXTRA STRENGTH) 500-325-65 MG PACK Take 1 packet by mouth every 8 (eight) hours as needed (for pain/headache.).    Marland Kitchen Aspirin-Salicylamide-Caffeine (BC FAST PAIN RELIEF) 650-195-33.3 MG PACK Take 1 packet by mouth every 8 (eight) hours as needed (for headache/pain.).    Marland Kitchen budesonide-formoterol (SYMBICORT) 80-4.5 MCG/ACT inhaler Inhale 2 puffs into the lungs 2 (two) times daily. (Patient taking differently: Inhale 2 puffs into the lungs at bedtime. )  1 Inhaler 12  . Norethin Ace-Eth Estrad-FE (TAYTULLA) 1-20 MG-MCG(24) CAPS Take 20 mg by mouth daily. (Patient taking differently: Take 20 mg by mouth daily at 6 PM. ) 90 capsule 3  . ondansetron (ZOFRAN-ODT) 4 MG disintegrating tablet Take 4 mg by mouth every 8 (eight) hours as needed (for motion sickness.).     Marland Kitchen. ranitidine (ZANTAC) 150 MG tablet Take 150 mg by mouth daily as needed (for heartburn/acid reflux.).     Marland Kitchen. venlafaxine XR (EFFEXOR-XR) 75 MG 24 hr  capsule TAKE 1 CAPSULE (75 MG TOTAL) BY MOUTH DAILY. (Patient taking differently: Take 75 mg by mouth daily at 6 PM. ) 30 capsule 0   No current facility-administered medications on file prior to visit.    No orders of the defined types were placed in this encounter.    Physical examination BP 120/82   Pulse 75   Ht 5' (1.524 m)   Wt 162 lb 3 oz (73.6 kg)   LMP 06/11/2016 (Approximate)   BMI 31.68 kg/m   Physical examination General NAD, Conversant  HEENT Atraumatic; Op clear with mmm.  Normo-cephalic. Pupils reactive. Anicteric sclerae  Thyroid/Neck Smooth without nodularity or enlargement. Normal ROM.  Neck Supple.  Skin No rashes, lesions or ulceration. Normal palpated skin turgor. No nodularity.  Breasts: No masses or discharge.  Symmetric.  No axillary adenopathy.  Lungs: Clear to auscultation.No rales or wheezes. Normal Respiratory effort, no retractions.  Heart: NSR.  No murmurs or rubs appreciated. No periferal edema  Abdomen: Soft.  Non-tender.  No masses.  No HSM. No hernia  Extremities: Moves all appropriately.  Normal ROM for age. No lymphadenopathy.  Neuro: Oriented to PPT.  Normal mood. Normal affect.     Pelvic:   Vulva: Normal appearance.  No lesions.  Vagina: No lesions or abnormalities noted.  Support: Normal pelvic support.  Urethra No masses tenderness or scarring.  Meatus Normal size without lesions or prolapse.  Cervix: Normal ectropion.  No lesions.  Anus: Normal exam.  No lesions.  Perineum: Normal exam.  No lesions.        Bimanual   Uterus: Normal size.  Non-tender.  Mobile.  AV.  Adnexae: No masses.  Non-tender to palpation.  Cul-de-sac: Negative for abnormality.    Assessment: 1. Dysmenorrhea   2. Preop examination   3. History of chronic pain   4. Tobacco user        PLAN: 1.  laparoscopy diagnostic/operative  Pre-op discussions regarding Risks and Benefits of her scheduled surgery.  LPY for Pain We have discussed the procedure  of Exploratory Laparoscopy in detail.  Medical management of pelvic pain has also been discussed and the patient has decided that this option is not satisfactory for her.  She has elected to have a Laparoscopy performed to attempt to diagnose and manage her pain.  I have informed her that Laparoscopy, like other surgical procedures, entails the following risks:  bleeding, infection, damage to bowel, bladder or other internal organ, and the risk or anesthesia.  She is aware that her risks are not limited to these.  We have discussed the possibility of Endometriosis and Pelvic Adhesive Disease.  We have discussed surgical as well as subsequent medical management of these conditions.  The patient is aware that both of these conditions can often be diagnosed and treated via Laparoscopy.  We have discussed the possibility  of recurrence and that despite adequate treatment at this time, pelvic pain may reoccur in the future.  We have discussed the possibility that Laparoscopy may not diagnose or cure her pain.  It is possible that no diagnosis will be made in up to 50% of cases.  I have answered all of her questions and I believe she has been well informed regarding the risks/benefits of Exploratory Laparoscopy for pelvic pain.    Elonda Husky, M.D. 07/11/2016 10:45 AM

## 2016-07-12 LAB — RPR: RPR: NONREACTIVE

## 2016-07-16 ENCOUNTER — Ambulatory Visit: Payer: Medicaid Other | Admitting: Certified Registered Nurse Anesthetist

## 2016-07-16 ENCOUNTER — Ambulatory Visit
Admission: RE | Admit: 2016-07-16 | Discharge: 2016-07-16 | Disposition: A | Discharge status: Home / Self Care | Payer: Medicaid Other | Source: Ambulatory Visit | Attending: Obstetrics and Gynecology | Admitting: Obstetrics and Gynecology

## 2016-07-16 ENCOUNTER — Encounter
Admission: RE | Disposition: A | Discharge status: Home / Self Care | Payer: Self-pay | Source: Ambulatory Visit | Attending: Obstetrics and Gynecology

## 2016-07-16 ENCOUNTER — Encounter: Payer: Self-pay | Admitting: *Deleted

## 2016-07-16 DIAGNOSIS — K219 Gastro-esophageal reflux disease without esophagitis: Secondary | ICD-10-CM | POA: Insufficient documentation

## 2016-07-16 DIAGNOSIS — Z7951 Long term (current) use of inhaled steroids: Secondary | ICD-10-CM | POA: Diagnosis not present

## 2016-07-16 DIAGNOSIS — N944 Primary dysmenorrhea: Secondary | ICD-10-CM

## 2016-07-16 DIAGNOSIS — N946 Dysmenorrhea, unspecified: Secondary | ICD-10-CM | POA: Diagnosis present

## 2016-07-16 DIAGNOSIS — Z793 Long term (current) use of hormonal contraceptives: Secondary | ICD-10-CM | POA: Diagnosis not present

## 2016-07-16 DIAGNOSIS — R102 Pelvic and perineal pain: Secondary | ICD-10-CM | POA: Diagnosis not present

## 2016-07-16 DIAGNOSIS — G8929 Other chronic pain: Secondary | ICD-10-CM | POA: Insufficient documentation

## 2016-07-16 DIAGNOSIS — F17210 Nicotine dependence, cigarettes, uncomplicated: Secondary | ICD-10-CM | POA: Diagnosis not present

## 2016-07-16 DIAGNOSIS — Z7982 Long term (current) use of aspirin: Secondary | ICD-10-CM | POA: Insufficient documentation

## 2016-07-16 DIAGNOSIS — F418 Other specified anxiety disorders: Secondary | ICD-10-CM | POA: Diagnosis not present

## 2016-07-16 DIAGNOSIS — N9419 Other specified dyspareunia: Secondary | ICD-10-CM | POA: Diagnosis not present

## 2016-07-16 DIAGNOSIS — Z79899 Other long term (current) drug therapy: Secondary | ICD-10-CM | POA: Diagnosis not present

## 2016-07-16 DIAGNOSIS — N9412 Deep dyspareunia: Secondary | ICD-10-CM | POA: Insufficient documentation

## 2016-07-16 HISTORY — PX: LAPAROSCOPY: SHX197

## 2016-07-16 LAB — POCT PREGNANCY, URINE: Preg Test, Ur: NEGATIVE

## 2016-07-16 SURGERY — LAPAROSCOPY, DIAGNOSTIC
Anesthesia: General

## 2016-07-16 MED ORDER — ROCURONIUM BROMIDE 100 MG/10ML IV SOLN
INTRAVENOUS | Status: DC | PRN
  Administered 2016-07-16: 20 mg via INTRAVENOUS

## 2016-07-16 MED ORDER — OXYCODONE-ACETAMINOPHEN 5-325 MG PO TABS: 1.0000 | ORAL_TABLET | ORAL | 0 refills | Status: DC | PRN

## 2016-07-16 MED ORDER — LACTATED RINGERS IV SOLN
INTRAVENOUS | Status: DC
  Administered 2016-07-16: 11:00:00 via INTRAVENOUS

## 2016-07-16 MED ORDER — ACETAMINOPHEN 10 MG/ML IV SOLN
INTRAVENOUS | Status: DC | PRN
  Administered 2016-07-16: 1000 mg via INTRAVENOUS

## 2016-07-16 MED ORDER — LIDOCAINE HCL (PF) 2 % IJ SOLN
INTRAMUSCULAR | Status: AC
  Filled 2016-07-16: qty 2

## 2016-07-16 MED ORDER — ONDANSETRON HCL 4 MG/2ML IJ SOLN: 4.0000 mg | Freq: Once | INTRAMUSCULAR | Status: DC | PRN

## 2016-07-16 MED ORDER — PROPOFOL 10 MG/ML IV BOLUS
INTRAVENOUS | Status: DC | PRN
  Administered 2016-07-16: 150 mg via INTRAVENOUS

## 2016-07-16 MED ORDER — ONDANSETRON HCL 4 MG/2ML IJ SOLN
INTRAMUSCULAR | Status: AC
  Filled 2016-07-16: qty 2

## 2016-07-16 MED ORDER — FENTANYL CITRATE (PF) 100 MCG/2ML IJ SOLN
INTRAMUSCULAR | Status: DC | PRN
  Administered 2016-07-16 (×3): 50 ug via INTRAVENOUS

## 2016-07-16 MED ORDER — SUGAMMADEX SODIUM 500 MG/5ML IV SOLN
INTRAVENOUS | Status: DC | PRN
  Administered 2016-07-16: 150 mg via INTRAVENOUS

## 2016-07-16 MED ORDER — PROPOFOL 10 MG/ML IV BOLUS
INTRAVENOUS | Status: AC
  Filled 2016-07-16: qty 20

## 2016-07-16 MED ORDER — KETOROLAC TROMETHAMINE 30 MG/ML IJ SOLN
INTRAMUSCULAR | Status: AC
  Filled 2016-07-16: qty 1

## 2016-07-16 MED ORDER — SUCCINYLCHOLINE CHLORIDE 200 MG/10ML IV SOSY
PREFILLED_SYRINGE | INTRAVENOUS | Status: AC
  Filled 2016-07-16: qty 10

## 2016-07-16 MED ORDER — FENTANYL CITRATE (PF) 100 MCG/2ML IJ SOLN
INTRAMUSCULAR | Status: AC
  Filled 2016-07-16: qty 2

## 2016-07-16 MED ORDER — ACETAMINOPHEN NICU IV SYRINGE 10 MG/ML
INTRAVENOUS | Status: AC
  Filled 2016-07-16: qty 1

## 2016-07-16 MED ORDER — FENTANYL CITRATE (PF) 100 MCG/2ML IJ SOLN
INTRAMUSCULAR | Status: AC
  Administered 2016-07-16: 25 ug via INTRAVENOUS
  Filled 2016-07-16: qty 2

## 2016-07-16 MED ORDER — ONDANSETRON HCL 4 MG/2ML IJ SOLN
INTRAMUSCULAR | Status: DC | PRN
  Administered 2016-07-16: 4 mg via INTRAVENOUS

## 2016-07-16 MED ORDER — ROCURONIUM BROMIDE 50 MG/5ML IV SOSY
PREFILLED_SYRINGE | INTRAVENOUS | Status: AC
  Filled 2016-07-16: qty 5

## 2016-07-16 MED ORDER — GLYCOPYRROLATE 0.2 MG/ML IJ SOLN
INTRAMUSCULAR | Status: DC | PRN
  Administered 2016-07-16: 0.2 mg via INTRAVENOUS

## 2016-07-16 MED ORDER — GLYCOPYRROLATE 0.2 MG/ML IJ SOLN
INTRAMUSCULAR | Status: AC
  Filled 2016-07-16: qty 1

## 2016-07-16 MED ORDER — SUCCINYLCHOLINE CHLORIDE 20 MG/ML IJ SOLN
INTRAMUSCULAR | Status: DC | PRN
  Administered 2016-07-16: 100 mg via INTRAVENOUS

## 2016-07-16 MED ORDER — MIDAZOLAM HCL 2 MG/2ML IJ SOLN
INTRAMUSCULAR | Status: DC | PRN
Start: 2016-07-16 — End: 2016-07-16
  Administered 2016-07-16: 2 mg via INTRAVENOUS

## 2016-07-16 MED ORDER — LIDOCAINE HCL (CARDIAC) 20 MG/ML IV SOLN
INTRAVENOUS | Status: DC | PRN
  Administered 2016-07-16: 50 mg via INTRAVENOUS

## 2016-07-16 MED ORDER — DEXAMETHASONE SODIUM PHOSPHATE 10 MG/ML IJ SOLN
INTRAMUSCULAR | Status: AC
  Filled 2016-07-16: qty 1

## 2016-07-16 MED ORDER — KETOROLAC TROMETHAMINE 30 MG/ML IJ SOLN
INTRAMUSCULAR | Status: DC | PRN
  Administered 2016-07-16: 30 mg via INTRAVENOUS

## 2016-07-16 MED ORDER — IBUPROFEN 800 MG PO TABS: 800.0000 mg | ORAL_TABLET | Freq: Three times a day (TID) | ORAL | 0 refills | Status: AC

## 2016-07-16 MED ORDER — DEXAMETHASONE SODIUM PHOSPHATE 10 MG/ML IJ SOLN
INTRAMUSCULAR | Status: DC | PRN
  Administered 2016-07-16: 10 mg via INTRAVENOUS

## 2016-07-16 MED ORDER — FENTANYL CITRATE (PF) 100 MCG/2ML IJ SOLN
25.0000 ug | INTRAMUSCULAR | Status: DC | PRN
  Administered 2016-07-16 (×4): 25 ug via INTRAVENOUS

## 2016-07-16 MED ORDER — MIDAZOLAM HCL 2 MG/2ML IJ SOLN
INTRAMUSCULAR | Status: AC
  Filled 2016-07-16: qty 2

## 2016-07-16 MED ORDER — PHENYLEPHRINE HCL 10 MG/ML IJ SOLN
INTRAMUSCULAR | Status: DC | PRN
  Administered 2016-07-16 (×4): 100 ug via INTRAVENOUS

## 2016-07-16 SURGICAL SUPPLY — 36 items
BLADE SURG SZ11 CARB STEEL (BLADE) ×3 IMPLANT
CANISTER SUCT 1200ML W/VALVE (MISCELLANEOUS) ×3 IMPLANT
CATH ROBINSON RED A/P 16FR (CATHETERS) ×3 IMPLANT
CHLORAPREP W/TINT 26ML (MISCELLANEOUS) ×3 IMPLANT
DRESSING TELFA 4X3 1S ST N-ADH (GAUZE/BANDAGES/DRESSINGS) ×9 IMPLANT
DRSG TEGADERM 2-3/8X2-3/4 SM (GAUZE/BANDAGES/DRESSINGS) ×9 IMPLANT
DRSG TEGADERM 4X4.75 (GAUZE/BANDAGES/DRESSINGS) ×9 IMPLANT
DRSG TELFA 3X8 NADH (GAUZE/BANDAGES/DRESSINGS) ×3 IMPLANT
GLOVE BIO SURGEON STRL SZ8 (GLOVE) ×3 IMPLANT
GLOVE INDICATOR 8.0 STRL GRN (GLOVE) ×3 IMPLANT
GOWN STRL REUS W/ TWL LRG LVL3 (GOWN DISPOSABLE) ×1 IMPLANT
GOWN STRL REUS W/ TWL XL LVL3 (GOWN DISPOSABLE) ×1 IMPLANT
GOWN STRL REUS W/TWL LRG LVL3 (GOWN DISPOSABLE) ×2 IMPLANT
GOWN STRL REUS W/TWL XL LVL3 (GOWN DISPOSABLE) ×2 IMPLANT
IRRIGATION STRYKERFLOW (MISCELLANEOUS) IMPLANT
IRRIGATOR STRYKERFLOW (MISCELLANEOUS) IMPLANT
IV LACTATED RINGERS 1000ML (IV SOLUTION) ×3 IMPLANT
KIT PINK PAD W/HEAD ARE REST (MISCELLANEOUS) ×3 IMPLANT
KIT PINK PAD W/HEAD ARM REST (MISCELLANEOUS) ×1 IMPLANT
KIT RM TURNOVER CYSTO AR (KITS) ×3 IMPLANT
LABEL OR SOLS (LABEL) IMPLANT
LIQUID BAND (GAUZE/BANDAGES/DRESSINGS) ×3 IMPLANT
NS IRRIG 500ML POUR BTL (IV SOLUTION) ×3 IMPLANT
PACK GYN LAPAROSCOPIC (MISCELLANEOUS) ×3 IMPLANT
PAD OB MATERNITY 4.3X12.25 (PERSONAL CARE ITEMS) ×3 IMPLANT
PAD PREP 24X41 OB/GYN DISP (PERSONAL CARE ITEMS) ×3 IMPLANT
POUCH ENDO CATCH 10MM SPEC (MISCELLANEOUS) IMPLANT
SCISSORS METZENBAUM CVD 33 (INSTRUMENTS) IMPLANT
SLEEVE ENDOPATH XCEL 5M (ENDOMECHANICALS) ×3 IMPLANT
SUT MNCRL 4-0 (SUTURE) ×2 IMPLANT
SUT MNCRL 4-0 27XMFL (SUTURE) ×1 IMPLANT
SUT VIC AB 0 UR5 27 (SUTURE) ×3 IMPLANT
SUTURE MNCRL 4-0 27XMF (SUTURE) ×1 IMPLANT
SYR 30ML LL (SYRINGE) ×3 IMPLANT
TROCAR XCEL NON-BLD 5MMX100MML (ENDOMECHANICALS) ×3 IMPLANT
TUBING INSUFFLATOR HI FLOW (MISCELLANEOUS) ×3 IMPLANT

## 2016-07-16 NOTE — Transfer of Care (Signed)
Immediate Anesthesia Transfer of Care Note  Patient: Brittney Carpenter  Procedure(s) Performed: Procedure(s): LAPAROSCOPY DIAGNOSTIC WITH BIOPSIES (N/A)  Patient Location: PACU  Anesthesia Type:General  Level of Consciousness: sedated  Airway & Oxygen Therapy: Patient Spontanous Breathing and Patient connected to face mask oxygen  Post-op Assessment: Report given to RN and Post -op Vital signs reviewed and stable  Post vital signs: Reviewed and stable  Last Vitals:  Vitals:   07/16/16 1011 07/16/16 1331  BP: 114/78 121/62  Pulse: 79 (!) 111  Resp: 16 (!) 23  Temp: (!) 35.9 C 36.8 C    Last Pain:  Vitals:   07/16/16 1331  TempSrc: Tympanic         Complications: No apparent anesthesia complications

## 2016-07-16 NOTE — Anesthesia Procedure Notes (Signed)
Procedure Name: Intubation Date/Time: 07/16/2016 12:13 PM Performed by: Ginger CarneMICHELET, Quron Ruddy Pre-anesthesia Checklist: Patient identified, Emergency Drugs available, Suction available, Patient being monitored and Timeout performed Patient Re-evaluated:Patient Re-evaluated prior to inductionOxygen Delivery Method: Circle system utilized Preoxygenation: Pre-oxygenation with 100% oxygen Intubation Type: IV induction Ventilation: Mask ventilation without difficulty Grade View: Grade I Tube type: Oral Tube size: 7.0 mm Number of attempts: 1 Airway Equipment and Method: Stylet Placement Confirmation: ETT inserted through vocal cords under direct vision,  positive ETCO2 and breath sounds checked- equal and bilateral Secured at: 20 cm Tube secured with: Tape Dental Injury: Teeth and Oropharynx as per pre-operative assessment

## 2016-07-16 NOTE — H&P (View-Only) (Signed)
PRE-OPERATIVE HISTORY AND PHYSICAL EXAM  PCP:  Fidel Levy, MD  HPI:  Brittney Carpenter is a 31 y.o. [phone].  Patient's last menstrual period was 06/11/2016 (approximate).  She presents today for a pre-op discussion and PE.  She has the following symptoms:  Chronic pelvic pain, significant dysmenorrhea/dyspareunia.  Failure of OCPs.  ROS: Constitutional: Denied constitutional symptoms, night sweats, recent illness, fatigue, fever, insomnia and weight loss.  Eyes: Denied eye symptoms, eye pain, photophobia, vision change and visual disturbance.  Ears/Nose/Throat/Neck: Denied ear, nose, throat or neck symptoms, hearing loss, nasal discharge, sinus congestion and sore throat.  Cardiovascular: Denied cardiovascular symptoms, arrhythmia, chest pain/pressure, edema, exercise intolerance, orthopnea and palpitations.  Respiratory: Denied pulmonary symptoms, asthma, pleuritic pain, productive sputum, cough, dyspnea and wheezing.  Gastrointestinal: Denied, gastro-esophageal reflux, melena, nausea and vomiting.  Genitourinary: See HPI for additional information.  Musculoskeletal: Denied musculoskeletal symptoms, stiffness, swelling, muscle weakness and myalgia.  Dermatologic: Denied dermatology symptoms, rash and scar.  Neurologic: Denied neurology symptoms, dizziness, headache, neck pain and syncope.  Psychiatric: Denied psychiatric symptoms, anxiety and depression.  Endocrine: Denied endocrine symptoms including hot flashes and night sweats.    OB History  Gravida Para Term Preterm AB Living  4 4 3 1   4   SAB TAB Ectopic Multiple Live Births        0 4    # Outcome Date GA Lbr Len/2nd Weight Sex Delivery Anes PTL Lv  4 Preterm 12/28/15 105w6d / 00:01 4 lb 4.4 oz (1.94 kg) M Vag-Spont None  LIV  3 Term 2011   7 lb (3.175 kg) M Vag-Spont   LIV  2 Term 2006   6 lb 14.4 oz (3.13 kg) F Vag-Spont   LIV  1 Term 2004   6 lb 1.6 oz (2.767 kg) F Vag-Spont   LIV      Past Medical History:   Diagnosis Date  . Asthma   . Chicken pox   . Depression   . Dysmenorrhea   . Frequent headaches   . GERD (gastroesophageal reflux disease)   . Migraine   . Seasonal allergies     Past Surgical History:  Procedure Laterality Date  . TONSILLECTOMY        SOCIAL HISTORY:  History  Smoking Status  . Current Every Day Smoker  . Packs/day: 1.00  . Types: Cigarettes  Smokeless Tobacco  . Never Used   History  Alcohol Use No    Comment: RARE    History  Drug Use No    Family History  Problem Relation Age of Onset  . Alcohol abuse Mother   . Colon polyps Mother   . Cancer Mother     Cervical  . Alcohol abuse Father   . Vascular Disease Father   . Uterine cancer Maternal Aunt   . Diabetes Maternal Grandmother   . Heart disease Neg Hx     ALLERGIES:  Watermelon [citrullus vulgaris]; Penicillins; and Vicodin [hydrocodone-acetaminophen]  Current Outpatient Prescriptions on File Prior to Visit  Medication Sig Dispense Refill  . Aspirin-Acetaminophen-Caffeine (GOODYS EXTRA STRENGTH) 500-325-65 MG PACK Take 1 packet by mouth every 8 (eight) hours as needed (for pain/headache.).    Marland Kitchen Aspirin-Salicylamide-Caffeine (BC FAST PAIN RELIEF) 650-195-33.3 MG PACK Take 1 packet by mouth every 8 (eight) hours as needed (for headache/pain.).    Marland Kitchen budesonide-formoterol (SYMBICORT) 80-4.5 MCG/ACT inhaler Inhale 2 puffs into the lungs 2 (two) times daily. (Patient taking differently: Inhale 2 puffs into the lungs at bedtime. )  1 Inhaler 12  . Norethin Ace-Eth Estrad-FE (TAYTULLA) 1-20 MG-MCG(24) CAPS Take 20 mg by mouth daily. (Patient taking differently: Take 20 mg by mouth daily at 6 PM. ) 90 capsule 3  . ondansetron (ZOFRAN-ODT) 4 MG disintegrating tablet Take 4 mg by mouth every 8 (eight) hours as needed (for motion sickness.).     Marland Kitchen. ranitidine (ZANTAC) 150 MG tablet Take 150 mg by mouth daily as needed (for heartburn/acid reflux.).     Marland Kitchen. venlafaxine XR (EFFEXOR-XR) 75 MG 24 hr  capsule TAKE 1 CAPSULE (75 MG TOTAL) BY MOUTH DAILY. (Patient taking differently: Take 75 mg by mouth daily at 6 PM. ) 30 capsule 0   No current facility-administered medications on file prior to visit.    No orders of the defined types were placed in this encounter.    Physical examination BP 120/82   Pulse 75   Ht 5' (1.524 m)   Wt 162 lb 3 oz (73.6 kg)   LMP 06/11/2016 (Approximate)   BMI 31.68 kg/m   Physical examination General NAD, Conversant  HEENT Atraumatic; Op clear with mmm.  Normo-cephalic. Pupils reactive. Anicteric sclerae  Thyroid/Neck Smooth without nodularity or enlargement. Normal ROM.  Neck Supple.  Skin No rashes, lesions or ulceration. Normal palpated skin turgor. No nodularity.  Breasts: No masses or discharge.  Symmetric.  No axillary adenopathy.  Lungs: Clear to auscultation.No rales or wheezes. Normal Respiratory effort, no retractions.  Heart: NSR.  No murmurs or rubs appreciated. No periferal edema  Abdomen: Soft.  Non-tender.  No masses.  No HSM. No hernia  Extremities: Moves all appropriately.  Normal ROM for age. No lymphadenopathy.  Neuro: Oriented to PPT.  Normal mood. Normal affect.     Pelvic:   Vulva: Normal appearance.  No lesions.  Vagina: No lesions or abnormalities noted.  Support: Normal pelvic support.  Urethra No masses tenderness or scarring.  Meatus Normal size without lesions or prolapse.  Cervix: Normal ectropion.  No lesions.  Anus: Normal exam.  No lesions.  Perineum: Normal exam.  No lesions.        Bimanual   Uterus: Normal size.  Non-tender.  Mobile.  AV.  Adnexae: No masses.  Non-tender to palpation.  Cul-de-sac: Negative for abnormality.    Assessment: 1. Dysmenorrhea   2. Preop examination   3. History of chronic pain   4. Tobacco user        PLAN: 1.  laparoscopy diagnostic/operative  Pre-op discussions regarding Risks and Benefits of her scheduled surgery.  LPY for Pain We have discussed the procedure  of Exploratory Laparoscopy in detail.  Medical management of pelvic pain has also been discussed and the patient has decided that this option is not satisfactory for her.  She has elected to have a Laparoscopy performed to attempt to diagnose and manage her pain.  I have informed her that Laparoscopy, like other surgical procedures, entails the following risks:  bleeding, infection, damage to bowel, bladder or other internal organ, and the risk or anesthesia.  She is aware that her risks are not limited to these.  We have discussed the possibility of Endometriosis and Pelvic Adhesive Disease.  We have discussed surgical as well as subsequent medical management of these conditions.  The patient is aware that both of these conditions can often be diagnosed and treated via Laparoscopy.  We have discussed the possibility  of recurrence and that despite adequate treatment at this time, pelvic pain may reoccur in the future.  We have discussed the possibility that Laparoscopy may not diagnose or cure her pain.  It is possible that no diagnosis will be made in up to 50% of cases.  I have answered all of her questions and I believe she has been well informed regarding the risks/benefits of Exploratory Laparoscopy for pelvic pain.    Elonda Husky, M.D. 07/11/2016 10:45 AM

## 2016-07-16 NOTE — OR Nursing (Signed)
Pt c/o of rash on left arm - several reddened areas that patient think could be from her fiance beard rubbing against her arm.  No other reddened areas noted on other areas of her body.

## 2016-07-16 NOTE — Op Note (Signed)
OPERATIVE NOTE:  Brittney IshikawaLisa J Carpenter PROCEDURE DATE: 07/16/2016   PREOPERATIVE DIAGNOSIS:  1. Dysmenorrhea 2. Dyspareunia 3. Chronic pelvic pain  POSTOPERATIVE DIAGNOSIS:  1. Dysmenorrhea 2. Dyspareunia 3. Chronic pelvic pain 4. Rule out endometriosis  PROCEDURE:  Laparoscopy with peritoneal biopsies   SURGEON:  Herold HarmsMartin A Roneisha Stern, MD ASSISTANTS: PA-S Westley FootsJacqueline Visser ANESTHESIA: General INDICATIONS: 31 y.o. [phone] with long history of chronic pelvic pain, severe dysmenorrhea, perimenstrual low backache, and deep thrusting dyspareunia which is refractory to birth control pills, presents for surgical evaluation to rule out endometriosis  FINDINGS:   Grossly normal uterus, fallopian tubes, ovaries; uterosacral ligament slightly prominent; cul-de-sac and left ovarian fossa with possible mild white scarring lesions suspicious for endometriosis. Appendix appears normal; bladder flap peritoneum appears normal; upper abdomen including liver and diaphragm appear normal; gallbladder not visualized.   I/O's: Total I/O In: -  Out: 200 [Urine:200] COUNTS:  YES SPECIMENS:  Peritoneal biopsies  Cul-de-sac  Left uterosacral ligament  Left ovarian fossa  Right ovarian fossa  ANTIBIOTIC PROPHYLAXIS:N/A COMPLICATIONS: None immediate  PROCEDURE IN DETAIL: Patient was brought to the operating room placed in the supine position. General endotracheal anesthesia was induced without difficulty. She was placed in the dorsal lithotomy position using bumblebee stirrups. A ChloraPrep and Hibiclens abdominal perineal intravaginal prep and drape is performed in standard fashion. Timeout was completed. Foley catheter is used to drain 200 cc of urine from the bladder. Hulka tenaculum was placed onto the cervix to facilitate uterine manipulation. Subumbilical vertical incision 5 mm in length was made. The Optiview laparoscopic trocar system was used to make direct entry into the abdominal pelvic cavity  without evidence of bowel or vascular injury. A second 5 mm port is  placed directly into the pelvic cavity through direct visualization. The above-noted findings were photo documented. Representative samples of the peritoneum were taken using a biopsy forceps including samples of the cul-de-sac, left uterosacral ligament, left ovarian fossa, and right ovarian fossa. Hemostasis was maintained. Following completion of survey of the abdomen and pelvis, the procedure was terminated with all instrumentation being removed from the abdominal pelvic cavity. Pneumoperitoneum was released. The incisions were closed with 4-0 Monocryl suture in a simple interrupted manner. Dermabond glue was placed over the incisions. Patient is awakened mobilized and taken to recovery room in satisfactory condition.  [name] A. Beatris Sie Francesco, MD, [facility]

## 2016-07-16 NOTE — Anesthesia Preprocedure Evaluation (Signed)
Anesthesia Evaluation  Patient identified by MRN, date of birth, ID band Patient awake    Reviewed: Allergy & Precautions, NPO status , Patient's Chart, lab work & pertinent test results  Airway Mallampati: II       Dental   Pulmonary asthma , Current Smoker,    Pulmonary exam normal        Cardiovascular negative cardio ROS Normal cardiovascular exam     Neuro/Psych  Headaches, Anxiety Depression    GI/Hepatic Neg liver ROS, GERD  Medicated,  Endo/Other  negative endocrine ROS  Renal/GU negative Renal ROS  negative genitourinary   Musculoskeletal negative musculoskeletal ROS (+)   Abdominal Normal abdominal exam  (+)   Peds negative pediatric ROS (+)  Hematology  (+) anemia ,   Anesthesia Other Findings   Reproductive/Obstetrics                             Anesthesia Physical Anesthesia Plan  ASA: II  Anesthesia Plan: General   Post-op Pain Management:    Induction: Intravenous  Airway Management Planned: Oral ETT  Additional Equipment:   Intra-op Plan:   Post-operative Plan: Extubation in OR  Informed Consent: I have reviewed the patients History and Physical, chart, labs and discussed the procedure including the risks, benefits and alternatives for the proposed anesthesia with the patient or authorized representative who has indicated his/her understanding and acceptance.   Dental advisory given  Plan Discussed with: CRNA and Surgeon  Anesthesia Plan Comments:         Anesthesia Quick Evaluation

## 2016-07-16 NOTE — Interval H&P Note (Signed)
History and Physical Interval Note:  07/16/2016 11:30 AM  Brittney Carpenter  has presented today for surgery, with the diagnosis of DYSMENORRHEA,HISTORY OF CHRONIC PAIN  The various methods of treatment have been discussed with the patient and family. After consideration of risks, benefits and other options for treatment, the patient has consented to  Procedure(s): LAPAROSCOPY DIAGNOSTIC WITH BIOPSIES (N/A) as a surgical intervention .  The patient's history has been reviewed, patient examined, no change in status, stable for surgery.  I have reviewed the patient's chart and labs.  Questions were answered to the patient's satisfaction.     Daphine DeutscherMartin A Day Greb

## 2016-07-16 NOTE — Anesthesia Post-op Follow-up Note (Cosign Needed)
Anesthesia QCDR form completed.        

## 2016-07-16 NOTE — Discharge Instructions (Signed)
  AMBULATORY SURGERY  DISCHARGE INSTRUCTIONS   1) The drugs that you were given will stay in your system until tomorrow so for the next 24 hours you should not:  A) Drive an automobile B) Make any legal decisions C) Drink any alcoholic beverage   2) You may resume regular meals tomorrow.  Today it is better to start with liquids and gradually work up to solid foods.  You may eat anything you prefer, but it is better to start with liquids, then soup and crackers, and gradually work up to solid foods.   3) Please notify your doctor immediately if you have any unusual bleeding, trouble breathing, redness and pain at the surgery site, drainage, fever, or pain not relieved by medication.    4) Additional Instructions: TAKE A STOOL SOFTENER TWICE A DAY WHILE TAKING NARCOTIC PAIN MEDICINE TO PREVENT CONSTIPATION   Please contact your physician with any problems or Same Day Surgery at [phone], Monday through Friday 6 am to 4 pm, or  at [facility] number at [phone].   

## 2016-07-17 ENCOUNTER — Encounter: Payer: Self-pay | Admitting: Obstetrics and Gynecology

## 2016-07-17 LAB — SURGICAL PATHOLOGY

## 2016-07-17 NOTE — Anesthesia Postprocedure Evaluation (Signed)
Anesthesia Post Note  Patient: Brittney Carpenter  Procedure(s) Performed: Procedure(s) (LRB): LAPAROSCOPY DIAGNOSTIC WITH BIOPSIES (N/A)  Patient location during evaluation: PACU Anesthesia Type: General Level of consciousness: awake and alert Pain management: pain level controlled Vital Signs Assessment: post-procedure vital signs reviewed and stable Respiratory status: spontaneous breathing, nonlabored ventilation, respiratory function stable and patient connected to nasal cannula oxygen Cardiovascular status: blood pressure returned to baseline and stable Postop Assessment: no signs of nausea or vomiting Anesthetic complications: no     Last Vitals:  Vitals:   07/16/16 1433 07/16/16 1515  BP: 119/66 97/60  Pulse: 66 69  Resp: 18 16  Temp: 36.8 C     Last Pain:  Vitals:   07/16/16 1515  TempSrc:   PainSc: 5                  Lenard SimmerAndrew Ysenia Filice

## 2016-07-20 ENCOUNTER — Telehealth: Payer: Self-pay | Admitting: Obstetrics and Gynecology

## 2016-07-20 MED ORDER — OXYCODONE-ACETAMINOPHEN 5-325 MG PO TABS: 1.0000 | ORAL_TABLET | ORAL | 0 refills | Status: AC | PRN

## 2016-07-20 NOTE — Telephone Encounter (Signed)
Ok per mad. Pt aware rx up front for p/u.

## 2016-07-20 NOTE — Telephone Encounter (Signed)
Brittney StanleyLisa wants her oxycodone w / tylenol refilled  [facility] [address]

## 2016-07-21 IMAGING — US US OB TRANSVAGINAL
1 series · 13 of 28 positions shown · non-contrast
Comparison: Pelvic ultrasound performed 03/03/2009

CLINICAL DATA: Acute onset of vaginal spotting for 1 day. Initial
encounter.

EXAM:
OBSTETRIC <14 WK US AND TRANSVAGINAL OB US
TECHNIQUE: Both transabdominal and transvaginal ultrasound examinations were
performed for complete evaluation of the gestation as well as the
maternal uterus, adnexal regions, and pelvic cul-de-sac.
Transvaginal technique was performed to assess early pregnancy.

[Series 1: us ob transvaginal · 0.21mm/px · 13 of 53 slices shown]
[im 2/53]
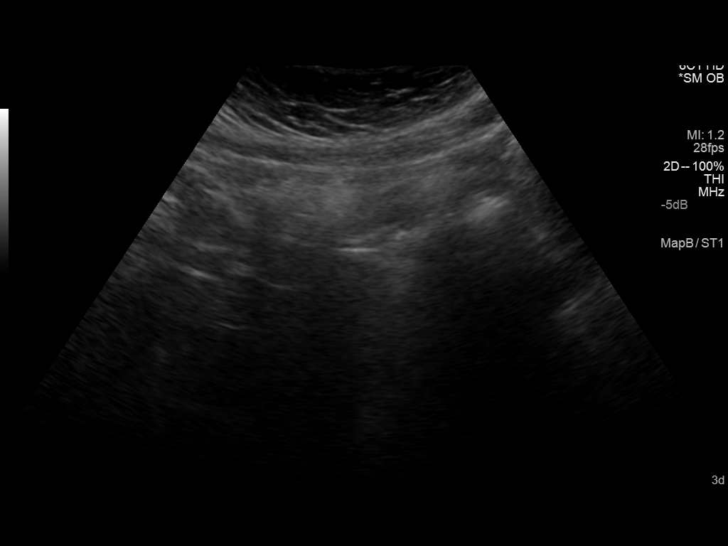
[im 6/53]
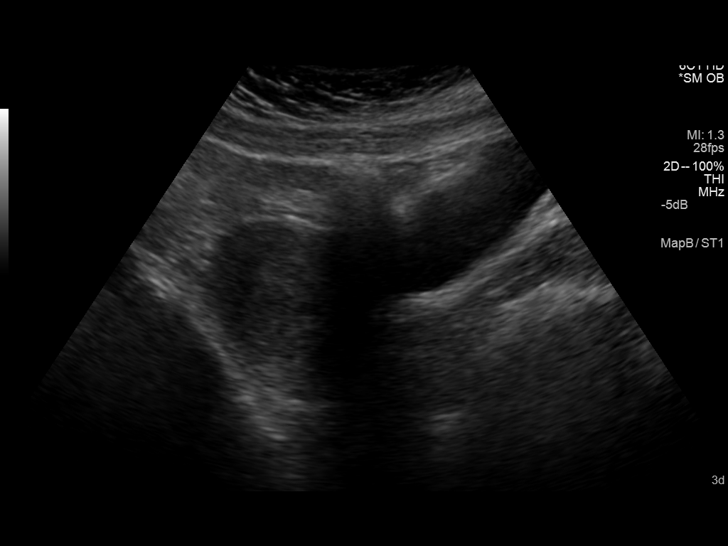
[im 10/53]
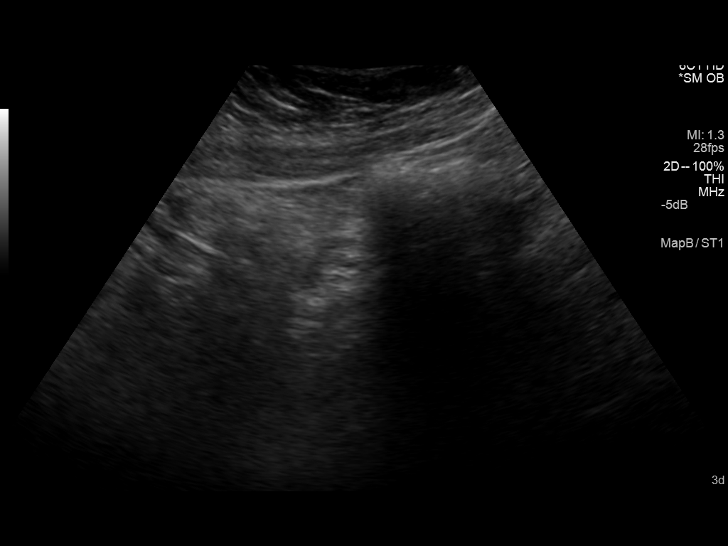
[im 14/53]
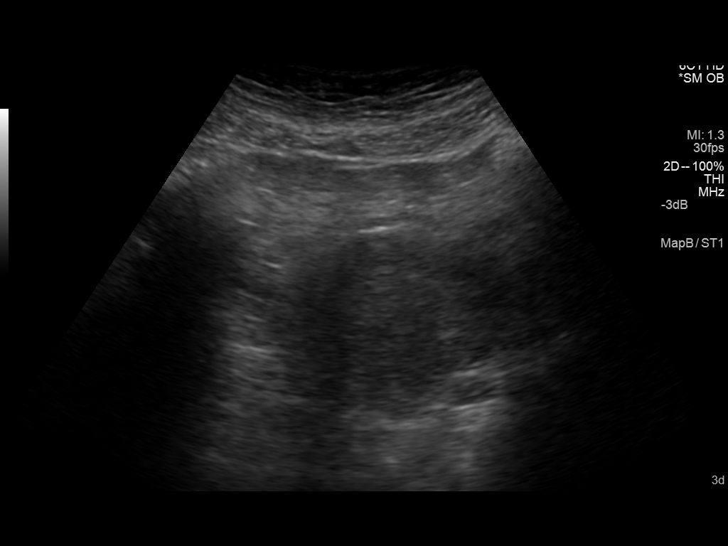
[im 18/53]
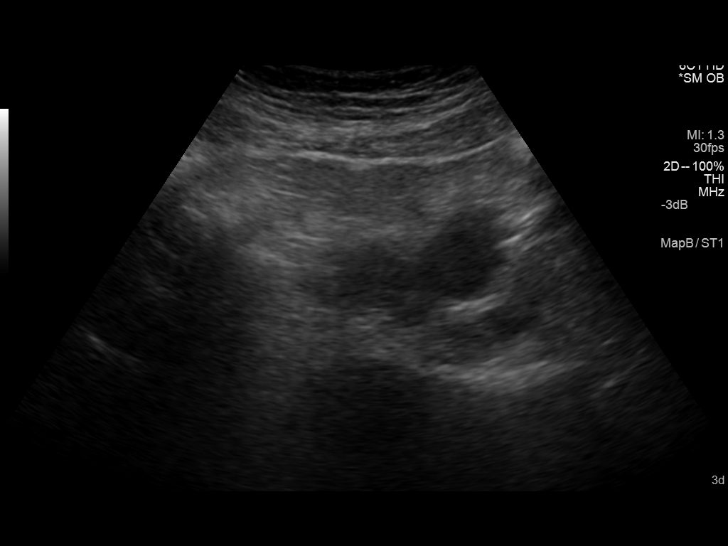
[im 22/53]
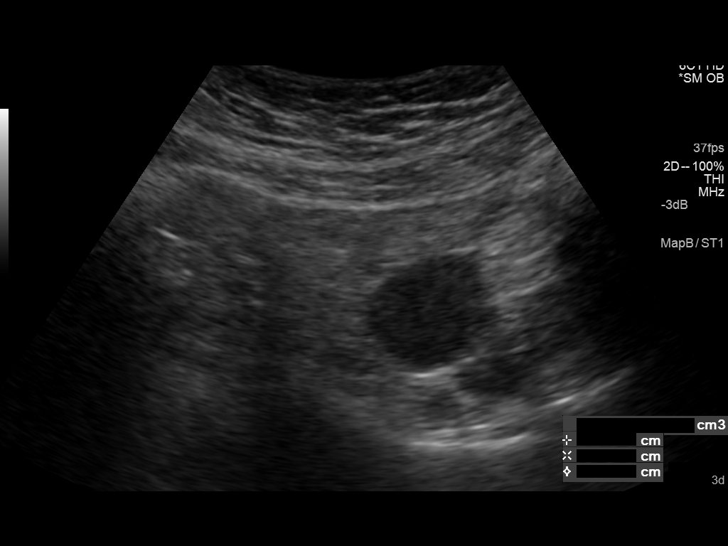
[im 27/53]
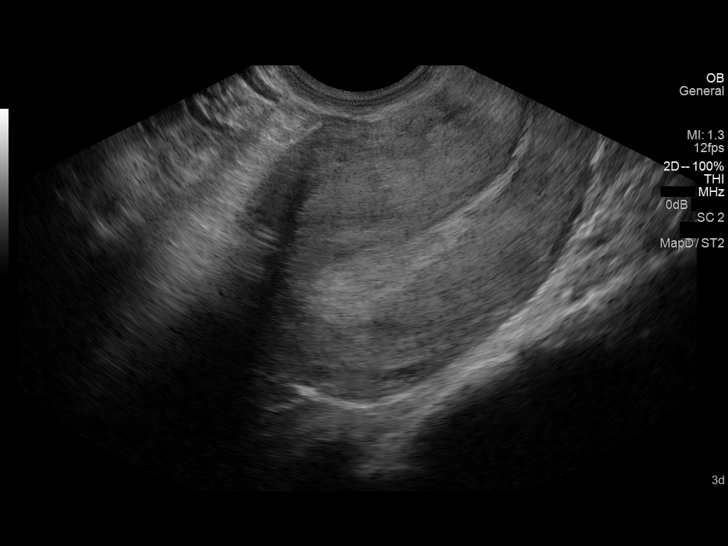
[im 31/53]
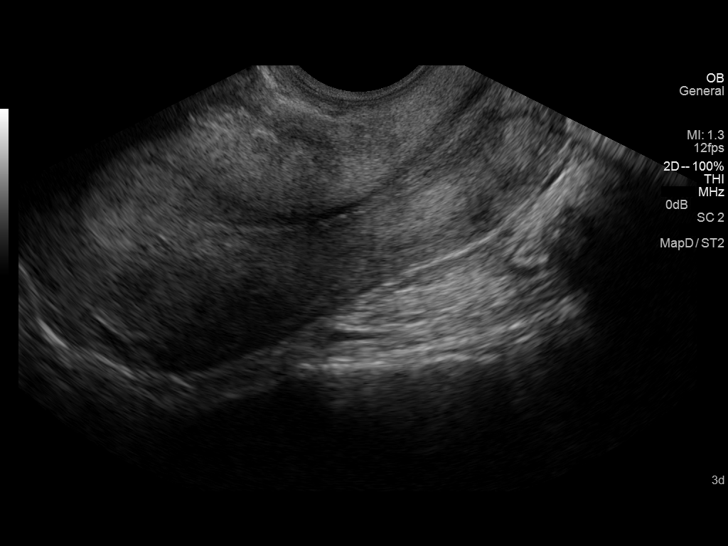
[im 35/53]
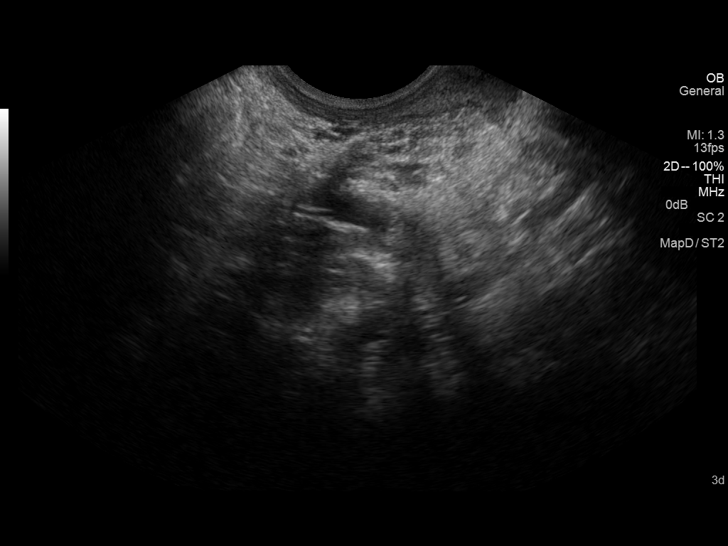
[im 39/53]
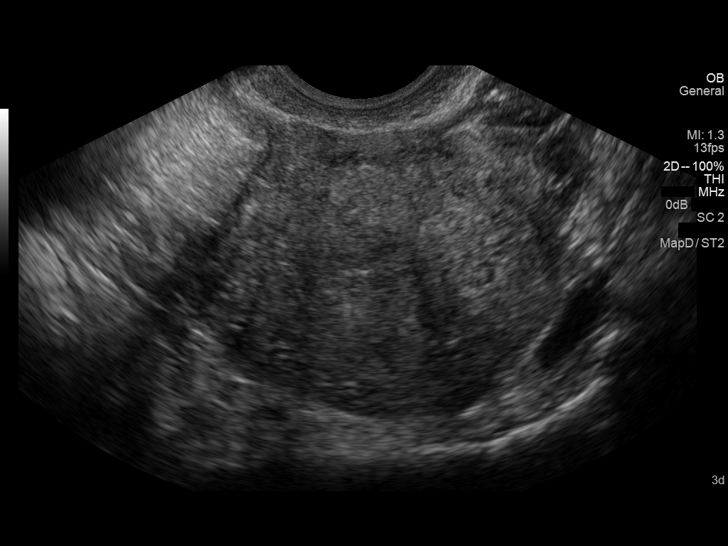
[im 43/53]
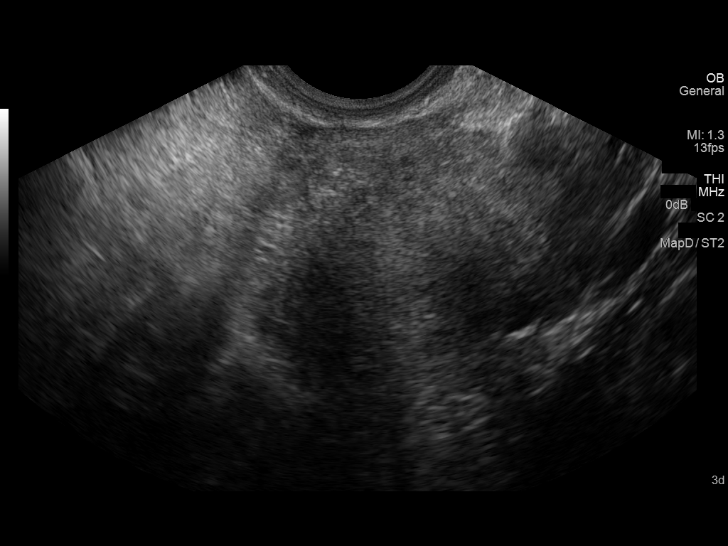
[im 47/53]
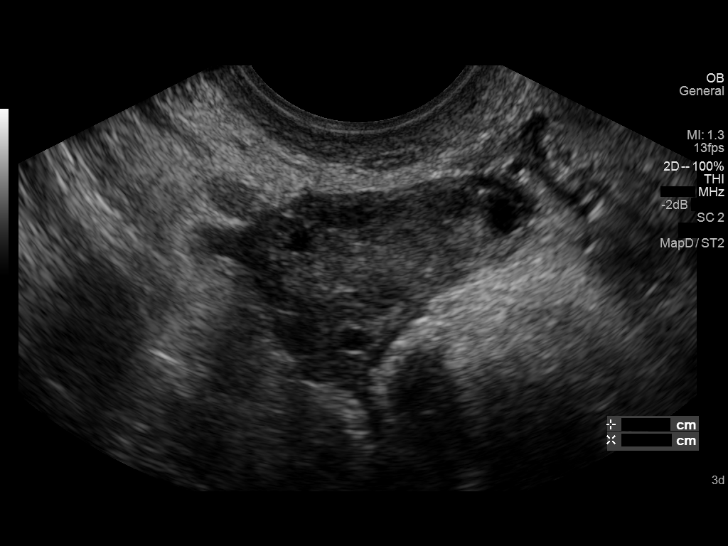
[im 51/53]
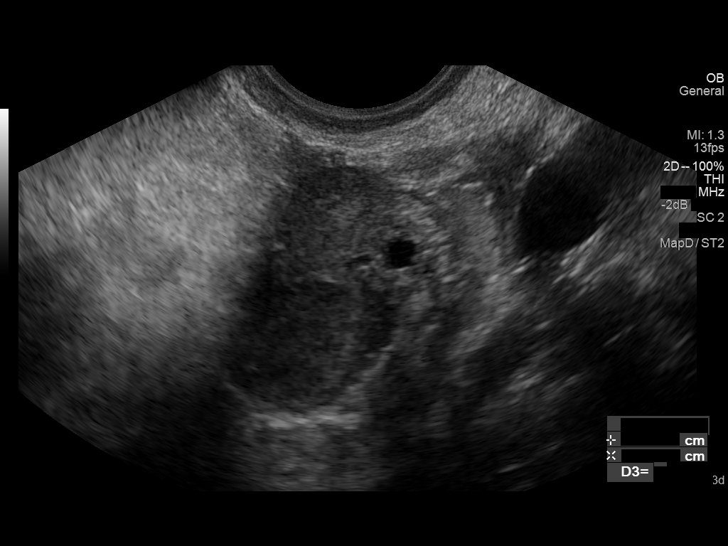

[13 of 28 positions shown; findings below may reference images not displayed]

FINDINGS: Intrauterine gestational sac: None seen.

Yolk sac:  N/A

Embryo:  N/A

Maternal uterus/adnexae: The uterus is unremarkable in appearance.
The endometrial echo complex measures 1.3 cm in thickness.

The ovaries are grossly unremarkable. The right ovary measures 2.9 x
1.8 x 2.1 cm, while the left ovary measures 3.2 x 1.8 x 2.3 cm. No
suspicious adnexal masses are seen; there is no evidence for ovarian
torsion.

No free fluid is seen within the pelvic cul-de-sac.
IMPRESSION: No intrauterine gestational sac seen. No evidence of ectopic
pregnancy. This remains within normal limits, given the quantitative
beta HCG level of 906. Follow-up pelvic ultrasound could be
considered in 2 weeks, if the patient's quantitative beta HCG
continues to rise.

## 2016-07-24 ENCOUNTER — Ambulatory Visit (INDEPENDENT_AMBULATORY_CARE_PROVIDER_SITE_OTHER): Payer: Medicaid Other | Admitting: Obstetrics and Gynecology

## 2016-07-24 ENCOUNTER — Encounter: Payer: Self-pay | Admitting: Obstetrics and Gynecology

## 2016-07-24 VITALS — BP 98/65 | HR 104 | Ht 60.0 in | Wt 160.9 lb

## 2016-07-24 DIAGNOSIS — N946 Dysmenorrhea, unspecified: Secondary | ICD-10-CM

## 2016-07-24 DIAGNOSIS — Z9889 Other specified postprocedural states: Secondary | ICD-10-CM

## 2016-07-24 DIAGNOSIS — Z09 Encounter for follow-up examination after completed treatment for conditions other than malignant neoplasm: Secondary | ICD-10-CM

## 2016-07-24 DIAGNOSIS — Z87898 Personal history of other specified conditions: Secondary | ICD-10-CM

## 2016-07-24 NOTE — Patient Instructions (Signed)
1. Birth control pill prescription is called in today 2. Patient will contact us when she desires to proceed with Mirena IUD insertion 3. Patient understands that she has to abstain from intercourse for 2 weeks prior to IUD insertion.

## 2016-07-24 NOTE — Progress Notes (Signed)
Chief complaint: 1. One week postop check 2. Status post laparoscopy 3. History of chronic pelvic pain/dysmenorrhea  Patient presents for 1 week postop check. She is doing well with Brittney Carpenter bladder function. No significant pelvic pain is identified.  PATHOLOGY: DIAGNOSIS:  A. CUL-DE-SAC; BIOPSY:  - UNREMARKABLE MESOTHELIAL LINED FIBROCONNECTIVE TISSUE.   B. UTERO SACRAL LIGAMENT, LEFT; BIOPSY:  - UNREMARKABLE MESOTHELIAL LINED FIBROCONNECTIVE TISSUE.   C. OVARIAN FOSSA, LEFT; BIOPSY:  - UNREMARKABLE MESOTHELIAL LINED FIBROCONNECTIVE TISSUE.   D. OVARIAN FOSSA, RIGHT; BIOPSY:  - UNREMARKABLE MESOTHELIAL LINED FIBROCONNECTIVE TISSUE.   Patient needs birth control pill refill. Patient reports having had excellent relief from dysmenorrhea when Brittney Carpenter the using the Mirena IUD.  Past medical history, past surgical history, problem list, medications, and allergies are reviewed  OBJECTIVE: BP 98/65   Pulse (!) 104   Ht 5' (1.524 m)   Wt 160 lb 14.4 oz (73 kg)   LMP 07/21/2016 (Exact Date)   Breastfeeding? No   BMI 31.42 kg/m  Well-appearing female in no acute distress Abdomen: Soft, nontender; laparoscopy port sites are well approximated without evidence of hernia, Dermabond glue is intact along with suture  ASSESSMENT: 1. One week status post laparoscopy with peritoneal biopsies for evaluation of chronic pelvic pain/dysmenorrhea 2. Biopsies did not show endometriosis  PLAN: 1. Resume activities as tolerated 2. Refill birth control pills 3. Return for Mirena IUD insertion; this appears to be best treatment for management of chronic pelvic pain/dysmenorrhea in a patient who is thought to have endometriosis (despite having negative laparoscopy biopsies)  Brittney HarmsMartin A Laurice Kimmons, MD  Note: This dictation was prepared with Dragon dictation along with smaller phrase technology. Any transcriptional errors that result from this process are unintentional.

## 2016-08-11 ENCOUNTER — Other Ambulatory Visit: Payer: Self-pay | Admitting: Obstetrics and Gynecology

## 2016-09-04 ENCOUNTER — Encounter: Payer: Medicaid Other | Admitting: Obstetrics and Gynecology

## 2016-09-06 ENCOUNTER — Other Ambulatory Visit: Payer: Self-pay | Admitting: Obstetrics and Gynecology

## 2016-09-06 DIAGNOSIS — J4520 Mild intermittent asthma, uncomplicated: Secondary | ICD-10-CM

## 2016-09-06 DIAGNOSIS — K2100 Gastro-esophageal reflux disease with esophagitis, without bleeding: Secondary | ICD-10-CM

## 2016-09-06 DIAGNOSIS — O219 Vomiting of pregnancy, unspecified: Secondary | ICD-10-CM

## 2016-09-06 DIAGNOSIS — Z3492 Encounter for supervision of normal pregnancy, unspecified, second trimester: Secondary | ICD-10-CM

## 2016-09-19 ENCOUNTER — Encounter: Payer: Medicaid Other | Admitting: Obstetrics and Gynecology

## 2016-11-23 ENCOUNTER — Other Ambulatory Visit: Payer: Self-pay | Admitting: Obstetrics and Gynecology

## 2016-11-23 DIAGNOSIS — J4520 Mild intermittent asthma, uncomplicated: Secondary | ICD-10-CM

## 2016-11-23 DIAGNOSIS — Z3492 Encounter for supervision of normal pregnancy, unspecified, second trimester: Secondary | ICD-10-CM

## 2016-11-23 DIAGNOSIS — K2100 Gastro-esophageal reflux disease with esophagitis, without bleeding: Secondary | ICD-10-CM

## 2016-11-23 DIAGNOSIS — O219 Vomiting of pregnancy, unspecified: Secondary | ICD-10-CM

## 2019-01-05 DIAGNOSIS — J309 Allergic rhinitis, unspecified: Secondary | ICD-10-CM | POA: Insufficient documentation

## 2019-01-05 DIAGNOSIS — J4530 Mild persistent asthma, uncomplicated: Secondary | ICD-10-CM | POA: Insufficient documentation

## 2019-01-05 DIAGNOSIS — K219 Gastro-esophageal reflux disease without esophagitis: Secondary | ICD-10-CM | POA: Insufficient documentation

## 2019-01-05 DIAGNOSIS — F411 Generalized anxiety disorder: Secondary | ICD-10-CM | POA: Insufficient documentation

## 2019-01-05 DIAGNOSIS — F330 Major depressive disorder, recurrent, mild: Secondary | ICD-10-CM | POA: Insufficient documentation

## 2020-02-26 ENCOUNTER — Other Ambulatory Visit: Payer: Medicaid Other

## 2020-02-26 ENCOUNTER — Other Ambulatory Visit: Payer: Self-pay

## 2020-02-26 DIAGNOSIS — Z20822 Contact with and (suspected) exposure to covid-19: Secondary | ICD-10-CM

## 2020-02-29 LAB — NOVEL CORONAVIRUS, NAA: SARS-CoV-2, NAA: NOT DETECTED

## 2020-03-28 DIAGNOSIS — M545 Low back pain, unspecified: Secondary | ICD-10-CM | POA: Insufficient documentation

## 2020-03-28 DIAGNOSIS — L700 Acne vulgaris: Secondary | ICD-10-CM | POA: Insufficient documentation

## 2020-07-04 ENCOUNTER — Encounter: Payer: Medicaid Other | Admitting: Certified Nurse Midwife

## 2020-07-20 ENCOUNTER — Other Ambulatory Visit: Payer: Self-pay

## 2020-07-20 ENCOUNTER — Encounter: Payer: Self-pay | Admitting: Certified Nurse Midwife

## 2020-07-20 ENCOUNTER — Ambulatory Visit (INDEPENDENT_AMBULATORY_CARE_PROVIDER_SITE_OTHER): Payer: Medicaid Other | Admitting: Certified Nurse Midwife

## 2020-07-20 VITALS — BP 104/68 | HR 73 | Ht 60.0 in | Wt 203.5 lb

## 2020-07-20 DIAGNOSIS — Z3046 Encounter for surveillance of implantable subdermal contraceptive: Secondary | ICD-10-CM | POA: Diagnosis not present

## 2020-07-20 NOTE — Progress Notes (Signed)
Brittney Carpenter is a 35 y.o. year old No obstetric history on file. Caucasian female here for Nexplanon removal and reinsertion.  She was given informed consent for removal and reinsertion of her Nexplanon. Her Nexplanon was placed 57yrs ago, No LMP recorded (lmp unknown). Patient has had an implant.,    Risks/benefits/side effects of Nexplanon have been discussed and her questions have been answered.  Specifically, a failure rate of 06/998 has been reported, with an increased failure rate if pt takes St. John's Wort and/or antiseizure medicaitons.  [name] is aware of the common side effect of irregular bleeding, which the incidence of decreases over time.  BP 104/68   Pulse 73   Ht 5' (1.524 m)   Wt 203 lb 8 oz (92.3 kg)   LMP  (LMP Unknown)   BMI 39.74 kg/m  No LMP recorded (lmp unknown). Patient has had an implant. No results found for this or any previous visit (from the past 24 hour(s)).   Appropriate time out taken. Nexplanon site identified.  Area prepped in usual sterile fashon. Two cc's of 2% lidocaine was used to anesthetize the area. A small stab incision was made right beside the implant on the distal portion.  The Nexplanon rod was grasped using hemostats and removed intact without difficulty.  The area was cleansed again with betadine and the Nexplanon was inserted per manufacturer's recommendations without difficulty.  Steri-strips and a pressure bandage was applied.  There was less than 3 cc blood loss. There were no complications.  The patient tolerated the procedure well.  She was instructed to keep the area clean and dry, remove pressure bandage in 24 hours, and keep insertion site covered with the steri-strips for 3-5 days.  She was given a card indicating date Nexplanon was inserted and date it needs to be removed.   Follow-up PRN problems.  Doreene Burke CNM

## 2020-07-20 NOTE — Patient Instructions (Signed)
Nexplanon Instructions   Keep bandage clean and dry for 24 hours  May use ice/Tylenol/Ibuprofen for soreness or pain  If you develop fever, drainage or increased warmth from incision site-contact office immediately   

## 2020-08-22 ENCOUNTER — Ambulatory Visit: Payer: Medicaid Other | Admitting: Dermatology

## 2020-09-06 ENCOUNTER — Encounter: Payer: Medicaid Other | Admitting: Certified Nurse Midwife

## 2020-09-22 ENCOUNTER — Encounter: Payer: Self-pay | Admitting: Certified Nurse Midwife

## 2021-01-23 ENCOUNTER — Ambulatory Visit (LOCAL_COMMUNITY_HEALTH_CENTER): Payer: Self-pay

## 2021-01-23 ENCOUNTER — Other Ambulatory Visit: Payer: Self-pay

## 2021-01-23 DIAGNOSIS — Z111 Encounter for screening for respiratory tuberculosis: Secondary | ICD-10-CM

## 2021-01-26 ENCOUNTER — Ambulatory Visit (LOCAL_COMMUNITY_HEALTH_CENTER): Payer: Self-pay | Admitting: Nurse Practitioner

## 2021-01-26 ENCOUNTER — Other Ambulatory Visit: Payer: Self-pay

## 2021-01-26 DIAGNOSIS — Z111 Encounter for screening for respiratory tuberculosis: Secondary | ICD-10-CM

## 2021-01-26 LAB — TB SKIN TEST
Induration: 0 mm
TB Skin Test: NEGATIVE

## 2021-01-26 NOTE — Progress Notes (Signed)
24 female in clinic today for a PPD reading.  PPD reading at 22mm. Glenna Fellows, RN

## 2021-01-31 ENCOUNTER — Ambulatory Visit: Payer: Self-pay

## 2021-04-03 DIAGNOSIS — R42 Dizziness and giddiness: Secondary | ICD-10-CM | POA: Insufficient documentation

## 2023-09-02 ENCOUNTER — Ambulatory Visit: Payer: Medicaid Other | Admitting: Advanced Practice Midwife

## 2023-10-08 ENCOUNTER — Encounter: Payer: Self-pay | Admitting: Advanced Practice Midwife

## 2023-10-08 ENCOUNTER — Other Ambulatory Visit (HOSPITAL_COMMUNITY)
Admission: RE | Admit: 2023-10-08 | Discharge: 2023-10-08 | Disposition: A | Discharge status: Home / Self Care | Source: Ambulatory Visit | Attending: Advanced Practice Midwife | Admitting: Advanced Practice Midwife

## 2023-10-08 ENCOUNTER — Ambulatory Visit (INDEPENDENT_AMBULATORY_CARE_PROVIDER_SITE_OTHER): Admitting: Advanced Practice Midwife

## 2023-10-08 VITALS — BP 109/83 | HR 89 | Ht 60.0 in | Wt 211.7 lb

## 2023-10-08 DIAGNOSIS — Z131 Encounter for screening for diabetes mellitus: Secondary | ICD-10-CM

## 2023-10-08 DIAGNOSIS — Z3046 Encounter for surveillance of implantable subdermal contraceptive: Secondary | ICD-10-CM | POA: Diagnosis not present

## 2023-10-08 DIAGNOSIS — R638 Other symptoms and signs concerning food and fluid intake: Secondary | ICD-10-CM

## 2023-10-08 DIAGNOSIS — Z124 Encounter for screening for malignant neoplasm of cervix: Secondary | ICD-10-CM | POA: Insufficient documentation

## 2023-10-08 DIAGNOSIS — R1084 Generalized abdominal pain: Secondary | ICD-10-CM

## 2023-10-08 DIAGNOSIS — Z01419 Encounter for gynecological examination (general) (routine) without abnormal findings: Secondary | ICD-10-CM | POA: Diagnosis present

## 2023-10-08 DIAGNOSIS — Z13220 Encounter for screening for lipoid disorders: Secondary | ICD-10-CM

## 2023-10-08 MED ORDER — ETONOGESTREL 68 MG ~~LOC~~ IMPL
68.0000 mg | DRUG_IMPLANT | Freq: Once | SUBCUTANEOUS | Status: AC
  Administered 2023-10-08: 68 mg via SUBCUTANEOUS

## 2023-10-08 NOTE — Patient Instructions (Addendum)
 Celiac Disease Antibodies Test Why am I having this test? The celiac disease antibodies test is a blood test that helps to diagnose celiac disease. It is usually the first step in diagnosing a person with celiac disease. People who have celiac disease cannot tolerate gluten and gliadin, which are proteins. These proteins are found in wheat and wheat products. You may have the test if you have symptoms of celiac disease. Some of these symptoms include: Long-term (chronic) diarrhea. Abdominal pain. Weight loss. You may also have this test if you: Have increased risk for celiac disease. This could be because a close family member has the disease. Are newly diagnosed with Type 1 diabetes or any other autoimmune disorder. What is being tested? This test examines your blood for the presence of specific antibodies that are common to celiac disease. Antibodies are proteins that your body normally makes to protect itself from germs that can make you sick. In people who have celiac disease, the body produces antibodies in response to gluten and gliadin. What kind of sample is taken?  A blood sample is required for this test. It is usually collected by inserting a needle into a blood vessel. How do I prepare for this test? You may be asked to provide a list of foods that you have eaten in the 48 hours before the test. For the test to be accurate, you will need to eat a gluten-containing diet before having the test. The test will show a strong antibody response if you do have celiac disease. Check with your health care provider for specific instructions. How are the results reported? Some of your test results will be reported as values in EU (European units of measurement). Your health care provider will compare your results to normal ranges that were established after testing a large group of people (reference ranges). Reference ranges may vary among labs and hospitals. For this test, common reference ranges  for the three common celiac disease antibodies are: Gliadin IgA and IgG: Birth to 39 years of age: less than 20 EU. 91 years of age and older: less than 25 EU. Tissue transglutaminase IgA, all ages: less than 20 EU. Other results will be reported as positive or negative. For this test, normal results are: Negative for endomysial IgA, all ages. What do the results mean? The following may mean that you have celiac disease: Higher levels of antibodies than the reference range for gliadin IgA and IgG, or tissue transglutaminase IgA. A positive result for endomysial IgA antibodies. High levels of gliadin antibodies may also be caused by Crohn's disease, colitis, and severe lactose intolerance. Talk with your health care provider about what your results mean. In some cases, your health care provider may do more testing to confirm the results. Questions to ask your health care provider Ask your health care provider, or the department that is doing the test: When will my results be ready? How will I get my results? What are my treatment options? What other tests do I need? What are my next steps? Summary The celiac disease antibodies test is a blood test that is done to help diagnose celiac disease. This test examines your blood for the presence of specific antibodies that are common in people who have celiac disease. The presence of certain antibodies, or antibody levels that are above the normal range, may indicate that you have celiac disease. Talk with your health care provider about what your results mean. This information is not intended to replace advice  given to you by your health care provider. Make sure you discuss any questions you have with your health care provider. Document Revised: 04/25/2021 Document Reviewed: 04/25/2021 Elsevier Patient Education  2024 [employer] Inc.Nexplanon  Instructions After Insertion  Keep bandage clean and dry for 24 hours  May use ice/Tylenol/Ibuprofen for  soreness or pain  If you develop fever, drainage or increased warmth from incision site-contact office immediately  Breast Self-Awareness Breast self-awareness is knowing how your breasts look and feel. You need to: Check your breasts on a regular basis. Tell your doctor about any changes. Become familiar with the look and feel of your breasts. This can help you catch a breast problem while it is still small and can be treated. You should do breast self-exams even if you have breast implants. What you need: A mirror. A well-lit room. A pillow or other soft object. How to do a breast self-exam Follow these steps to do a breast self-exam: Look for changes  Take off all the clothes above your waist. Stand in front of a mirror in a room with good lighting. Put your hands down at your sides. Compare your breasts in the mirror. Look for any difference between them, such as: A difference in shape. A difference in size. Wrinkles, dips, and bumps in one breast and not the other. Look at each breast for changes in the skin, such as: Redness. Scaly areas. Skin that has gotten thicker. Dimpling. Open sores (ulcers). Look for changes in your nipples, such as: Fluid coming out of a nipple. Fluid around a nipple. Bleeding. Dimpling. Redness. A nipple that looks pushed in (retracted), or that has changed position. Feel for changes Lie on your back. Feel each breast. To do this: Pick a breast to feel. Place a pillow under the shoulder closest to that breast. Put the arm closest to that breast behind your head. Feel the nipple area of that breast using the hand of your other arm. Feel the area with the pads of your three middle fingers by making small circles with your fingers. Use light, medium, and firm pressure. Continue the overlapping circles, moving downward over the breast. Keep making circles with your fingers. Stop when you feel your ribs. Start making circles with your fingers  again, this time going upward until you reach your collarbone. Then, make circles outward across your breast and into your armpit area. Squeeze your nipple. Check for discharge and lumps. Repeat these steps to check your other breast. Sit or stand in the tub or shower. With soapy water on your skin, feel each breast the same way you did when you were lying down. Write down what you find Writing down what you find can help you remember what to tell your doctor. Write down: What is normal for each breast. Any changes you find in each breast. These include: The kind of changes you find. A tender or painful breast. Any lump you find. Write down its size and where it is. When you last had your monthly period (menstrual cycle). General tips If you are breastfeeding, the best time to check your breasts is after you feed your baby or after you use a breast pump. If you get monthly bleeding, the best time to check your breasts is 5-7 days after your monthly cycle ends. With time, you will become comfortable with the self-exam. You will also start to know if there are changes in your breasts. Contact a doctor if: You see a change in the shape  or size of your breasts or nipples. You see a change in the skin of your breast or nipples, such as red or scaly skin. You have fluid coming from your nipples that is not normal. You find a new lump or thick area. You have breast pain. You have any concerns about your breast health. Summary Breast self-awareness includes looking for changes in your breasts and feeling for changes within your breasts. You should do breast self-awareness in front of a mirror in a well-lit room. If you get monthly periods (menstrual cycles), the best time to check your breasts is 5-7 days after your period ends. Tell your doctor about any changes you see in your breasts. Changes include changes in size, changes on the skin, painful or tender breasts, or fluid from your nipples  that is not normal. This information is not intended to replace advice given to you by your health care provider. Make sure you discuss any questions you have with your health care provider. Document Revised: 11/09/2021 Document Reviewed: 04/06/2021 Elsevier Patient Education  2024 [employer].   Preventive Care 34-60 Years Old, Female Preventive care refers to lifestyle choices and visits with your health care provider that can promote health and wellness. Preventive care visits are also called wellness exams. What can I expect for my preventive care visit? Counseling During your preventive care visit, your health care provider may ask about your: Medical history, including: Past medical problems. Family medical history. Pregnancy history. Current health, including: Menstrual cycle. Method of birth control. Emotional well-being. Home life and relationship well-being. Sexual activity and sexual health. Lifestyle, including: Alcohol, nicotine or tobacco, and drug use. Access to firearms. Diet, exercise, and sleep habits. Work and work Astronomer. Sunscreen use. Safety issues such as seatbelt and bike helmet use. Physical exam Your health care provider may check your: Height and weight. These may be used to calculate your BMI (body mass index). BMI is a measurement that tells if you are at a healthy weight. Waist circumference. This measures the distance around your waistline. This measurement also tells if you are at a healthy weight and may help predict your risk of certain diseases, such as type 2 diabetes and high blood pressure. Heart rate and blood pressure. Body temperature. Skin for abnormal spots. What immunizations do I need?  Vaccines are usually given at various ages, according to a schedule. Your health care provider will recommend vaccines for you based on your age, medical history, and lifestyle or other factors, such as travel or where you work. What tests do I  need? Screening Your health care provider may recommend screening tests for certain conditions. This may include: Pelvic exam and Pap test. Lipid and cholesterol levels. Diabetes screening. This is done by checking your blood sugar (glucose) after you have not eaten for a while (fasting). Hepatitis B test. Hepatitis C test. HIV (human immunodeficiency virus) test. STI (sexually transmitted infection) testing, if you are at risk. BRCA-related cancer screening. This may be done if you have a family history of breast, ovarian, tubal, or peritoneal cancers. Talk with your health care provider about your test results, treatment options, and if necessary, the need for more tests. Follow these instructions at home: Eating and drinking  Eat a healthy diet that includes fresh fruits and vegetables, whole grains, lean protein, and low-fat dairy products. Take vitamin and mineral supplements as recommended by your health care provider. Do not drink alcohol if: Your health care provider tells you not to drink. You  are pregnant, may be pregnant, or are planning to become pregnant. If you drink alcohol: Limit how much you have to 0-1 drink a day. Know how much alcohol is in your drink. In the [place] one drink equals one 12 oz bottle of beer (355 mL), one 5 oz glass of wine (148 mL), or one 1 oz glass of hard liquor (44 mL). Lifestyle Brush your teeth every morning and night with fluoride toothpaste. Floss one time each day. Exercise for at least 30 minutes 5 or more days each week. Do not use any products that contain nicotine or tobacco. These products include cigarettes, chewing tobacco, and vaping devices, such as e-cigarettes. If you need help quitting, ask your health care provider. Do not use drugs. If you are sexually active, practice safe sex. Use a condom or other form of protection to prevent STIs. If you do not wish to become pregnant, use a form of birth control. If you plan to become  pregnant, see your health care provider for a prepregnancy visit. Find healthy ways to manage stress, such as: Meditation, yoga, or listening to music. Journaling. Talking to a trusted person. Spending time with friends and family. Minimize exposure to UV radiation to reduce your risk of skin cancer. Safety Always wear your seat belt while driving or riding in a vehicle. Do not drive: If you have been drinking alcohol. Do not ride with someone who has been drinking. If you have been using any mind-altering substances or drugs. While texting. When you are tired or distracted. Wear a helmet and other protective equipment during sports activities. If you have firearms in your house, make sure you follow all gun safety procedures. Seek help if you have been physically or sexually abused. What's next? Go to your health care provider once a year for an annual wellness visit. Ask your health care provider how often you should have your eyes and teeth checked. Stay up to date on all vaccines. This information is not intended to replace advice given to you by your health care provider. Make sure you discuss any questions you have with your health care provider. Document Revised: 11/30/2020 Document Reviewed: 11/30/2020 Elsevier Patient Education  2024 [employer].

## 2023-10-09 LAB — HEMOGLOBIN A1C
Est. average glucose Bld gHb Est-mCnc: 103 mg/dL
Hgb A1c MFr Bld: 5.2 % (ref 4.8–5.6)

## 2023-10-09 LAB — COMPREHENSIVE METABOLIC PANEL WITH GFR
ALT: 27 IU/L (ref 0–32)
AST: 26 IU/L (ref 0–40)
Albumin: 4.5 g/dL (ref 3.9–4.9)
Alkaline Phosphatase: 87 IU/L (ref 44–121)
BUN/Creatinine Ratio: 10 (ref 9–23)
BUN: 9 mg/dL (ref 6–20)
Bilirubin Total: 0.4 mg/dL (ref 0.0–1.2)
CO2: 22 mmol/L (ref 20–29)
Calcium: 9.6 mg/dL (ref 8.7–10.2)
Chloride: 102 mmol/L (ref 96–106)
Creatinine, Ser: 0.87 mg/dL (ref 0.57–1.00)
Globulin, Total: 2.6 g/dL (ref 1.5–4.5)
Glucose: 81 mg/dL (ref 70–99)
Potassium: 4.6 mmol/L (ref 3.5–5.2)
Sodium: 140 mmol/L (ref 134–144)
Total Protein: 7.1 g/dL (ref 6.0–8.5)
eGFR: 87 mL/min/1.73 (ref >59)

## 2023-10-09 LAB — LIPID PANEL
Chol/HDL Ratio: 3.9 ratio (ref 0.0–4.4)
Cholesterol, Total: 171 mg/dL (ref 100–199)
HDL: 44 mg/dL (ref >39)
LDL Chol Calc (NIH): 96 mg/dL (ref 0–99)
Triglycerides: 178 mg/dL — ABNORMAL HIGH (ref 0–149)
VLDL Cholesterol Cal: 31 mg/dL (ref 5–40)

## 2023-10-09 LAB — CBC
Hematocrit: 43.9 % (ref 34.0–46.6)
Hemoglobin: 15.1 g/dL (ref 11.1–15.9)
MCH: 31.4 pg (ref 26.6–33.0)
MCHC: 34.4 g/dL (ref 31.5–35.7)
MCV: 91 fL (ref 79–97)
Platelets: 267 x10E3/uL (ref 150–450)
RBC: 4.81 x10E6/uL (ref 3.77–5.28)
RDW: 12.8 % (ref 11.7–15.4)
WBC: 9.7 x10E3/uL (ref 3.4–10.8)

## 2023-10-09 LAB — TSH: TSH: 2.03 u[IU]/mL (ref 0.450–4.500)

## 2023-10-14 ENCOUNTER — Encounter: Payer: Self-pay | Admitting: Advanced Practice Midwife

## 2023-10-14 NOTE — Progress Notes (Signed)
 GYNECOLOGY PROCEDURE NOTE  Patient is a 38 y.o. [id number] presenting for Nexplanon  removal and reinsertion as her desired means of contraception.  No LMP recorded. Patient has had an implant.  She understands that Nexplanon  is a progesterone only therapy, and that patients often have irregular and unpredictable vaginal bleeding or amenorrhea. She understands that other side effects are possible related to systemic progesterone, including but not limited to, headaches, breast tenderness, nausea, and irritability. While effective at preventing pregnancy long acting reversible contraceptives do not prevent transmission of sexually transmitted diseases and use of barrier methods for this purpose was discussed. The placement procedure for Nexplanon  was reviewed with the patient in detail including risks of nerve injury, infection, bleeding and injury to other muscles or tendons. She understands that the Nexplanon  implant is good for 3 years and needs to be removed at the end of that time.  She understands that Nexplanon  is an extremely effective option for contraception, with failure rate of <1%. This information is reviewed today and all questions were answered. Informed consent was obtained, both verbally and written.   Nexplanon  removal discussed in detail.  Risks of infection, bleeding, nerve injury all reviewed.  Patient understands risks and desires to proceed.  Verbal and signed consent obtained.  Patient is certain she wants the Nexplanon  removed and new Nexplanon  inserted. All questions answered.    Review of Systems  Constitutional:  Negative for chills and fever.       Positive for hot/cold intolerance  HENT:  Negative for congestion, ear discharge, ear pain, hearing loss, sinus pain and sore throat.   Eyes:  Negative for blurred vision and double vision.  Respiratory:  Positive for shortness of breath. Negative for cough and wheezing.   Cardiovascular:  Negative for chest pain,  palpitations and leg swelling.  Gastrointestinal:  Positive for abdominal pain and diarrhea. Negative for blood in stool, constipation, heartburn, melena, nausea and vomiting.       Positive for reflux  Genitourinary:  Negative for dysuria, flank pain, frequency, hematuria and urgency.  Musculoskeletal:  Negative for back pain, joint pain and myalgias.  Skin:  Negative for itching and rash.  Neurological:  Negative for dizziness, tingling, tremors, sensory change, speech change, focal weakness, seizures, loss of consciousness, weakness and headaches.  Endo/Heme/Allergies:  Negative for environmental allergies. Does not bruise/bleed easily.  Psychiatric/Behavioral:  Negative for depression, hallucinations, memory loss, substance abuse and suicidal ideas. The patient is not nervous/anxious and does not have insomnia.      Vital Signs: BP 109/83   Pulse 89   Ht 5' (1.524 m)   Wt 211 lb 11.2 oz (96 kg)   BMI 41.34 kg/m  Constitutional: Well nourished, well developed female in no acute distress.  Skin: Warm and dry.  Cardiovascular: Regular rate and rhythm.   Extremity: no edema Respiratory:  Normal respiratory effort Psych: Alert and Oriented x3. No memory deficits. Normal mood and affect.    Procedure: Appropriate time out taken. Patient placed in dorsal supine with left arm above head, elbow flexed at 90 degrees, arm resting on examination table.  Nexplanon  identified without problems.  Betadine scrub x3.  2 ml of 1% lidocaine without epinephrine injected under Nexplanon  device without problems.  Sterile gloves applied.  Small 0.5cm incision made at distal tip of Nexplanon  device with 11 blade scalpel.  Nexplanon  brought to incision and grasped with a small kelly clamp.  Nexplanon  removed intact without problems.  Pressure applied to incision.  Hemostasis  obtained.    Nexplanon  removed form sterile blister packaging,  Device confirmed in needle, before inserting full length of needle,  tenting up the skin as the needle was advanced.  The drug eluding rod was then deployed by pulling back the slider per the manufactures recommendation.  The implant was palpable by the clinician as well as the patient.  The insertion site was dressed with a steri strip and band aid before applying  a kerlex bandage pressure dressing. Minimal blood loss was noted during the procedure.  The patient tolerated the procedure well.    Assessment: 38 y.o. year old female now s/p uncomplicated Nexplanon  removal and reinsertion.  Plan: 1.  Patient given post procedure precautions and asked to call for fever, chills, redness or drainage from her incision, bleeding from incision.  She understands she will likely have a small bruise near site of removal and reinsertion and can remove bandage tomorrow and steri-strips in approximately 1 week.  2) She was instructed to wear the bandage for 24 hours, call with any signs of infection.  She was given the Nexplanon  card and instructed to have the rod removed in 3 years.  3) Contraception: Nexplanon    Angelita Kendall, CNM [facility] 10/14/2023 11:37 AM  [id number] for nexplanon  device  CPT [phone] for procedure  J2001 for lidocaine administration Modifer 25

## 2023-10-14 NOTE — Progress Notes (Signed)
 [facility]   Gynecology Annual Exam   PCP: [facility]  Chief Complaint:  Chief Complaint  Patient presents with   Annual Exam   Contraception    Nexplanon     History of Present Illness: Patient is a 38 y.o. [phone] presents for annual exam. The patient has complaints today of ongoing stomach pain that is constant. She describes generalized abdominal pain with bloating and reflux. She requests a referral to GI. She has made some dietary changes. Has not tried eliminating gluten.  LMP: No LMP recorded. Patient has had an implant.  Postcoital Bleeding: not applicable Dysmenorrhea: mild cramping  The patient is not currently sexually active. She currently uses Nexplanon  for contraception/cycle control. She denies dyspareunia.  The patient does perform self breast exams.  There is notable family history of breast or ovarian cancer in her family. Family history previously reviewed.  The patient wears seatbelts: yes.   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 She has made changes to her diet- cut out sodas. Usually has 5-6 hours of sleep. Currently vapes and doesn't want to quit.  The patient denies current symptoms of depression.    Review of Systems: Review of Systems  Constitutional:  Negative for chills and fever.       Positive for hot/cold intolerance  HENT:  Negative for congestion, ear discharge, ear pain, hearing loss, sinus pain and sore throat.   Eyes:  Negative for blurred vision and double vision.  Respiratory:  Positive for shortness of breath. Negative for cough and wheezing.   Cardiovascular:  Negative for chest pain, palpitations and leg swelling.  Gastrointestinal:  Positive for abdominal pain and diarrhea. Negative for blood in stool, constipation, heartburn, melena, nausea and vomiting.       Positive for reflux  Genitourinary:  Negative for dysuria, flank pain, frequency,  hematuria and urgency.  Musculoskeletal:  Negative for back pain, joint pain and myalgias.  Skin:  Negative for itching and rash.  Neurological:  Negative for dizziness, tingling, tremors, sensory change, speech change, focal weakness, seizures, loss of consciousness, weakness and headaches.  Endo/Heme/Allergies:  Negative for environmental allergies. Does not bruise/bleed easily.  Psychiatric/Behavioral:  Negative for depression, hallucinations, memory loss, substance abuse and suicidal ideas. The patient is not nervous/anxious and does not have insomnia.     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 Patient has had an implant. Contraception: Nexplanon  Last Pap: 2010 Results were: no abnormalities   Obstetric History: [id number]  Family History:  Family History  Problem Relation Age of Onset   Uterine cancer Mother    Ovarian cancer Maternal Aunt    Diabetes Maternal Grandmother    Stroke Maternal Grandmother     Social History:  Social History   Socioeconomic History   Marital status: Unknown    Spouse name: Not on file   Number  of children: Not on file   Years of education: Not on file   Highest education level: Not on file  Occupational History   Not on file  Tobacco Use   Smoking status: Every Day    Types: E-cigarettes   Smokeless tobacco: Never  Vaping Use   Vaping status: Every Day  Substance and Sexual Activity    Alcohol use: Not Currently   Drug use: Not Currently   Sexual activity: Not Currently    Birth control/protection: Implant  Other Topics Concern   Not on file  Social History Narrative   Not on file   Social Drivers of Health   Financial Resource Strain: Not on file  Food Insecurity: Not on file  Transportation Needs: Not on file  Physical Activity: Not on file  Stress: Not on file  Social Connections: Not on file  Intimate Partner Violence: Not on file    Allergies:  Allergies  Allergen Reactions   Bee Venom Hives and Swelling   Penicillins     Medications: Prior to Admission medications   Medication Sig Start Date End Date Taking? Authorizing Provider  etonogestrel  (NEXPLANON ) 68 MG IMPL implant 1 each by Subdermal route once.   Yes [provider]  loratadine (CLARITIN) 10 MG tablet  07/15/20   [provider]  omeprazole (PRILOSEC) 20 MG capsule  07/15/20   [provider]  PROAIR HFA 108 (90 Base) MCG/ACT inhaler  07/15/20   [provider]  SYMBICORT 160-4.5 MCG/ACT inhaler Inhale 2 puffs into the lungs 2 (two) times daily. 01/27/20   [provider]    Physical Exam Vitals: Blood pressure 109/83, pulse 89, height 5' (1.524 m), weight 211 lb 11.2 oz (96 kg).  General: NAD HEENT: normocephalic, anicteric Thyroid: no enlargement, no palpable nodules Pulmonary: No increased work of breathing, CTAB Cardiovascular: RRR, distal pulses 2+ Breast: Breast symmetrical, no tenderness, no palpable nodules or masses, no skin or nipple retraction present, no nipple discharge.  No axillary or supraclavicular lymphadenopathy. Abdomen: NABS, soft, non-tender, non-distended.  Umbilicus without lesions.  No hepatomegaly, splenomegaly or masses palpable. No evidence of hernia  Genitourinary:  External: Normal external female genitalia.  Normal urethral meatus, normal Bartholin's and Skene's glands.    Vagina: Normal vaginal mucosa, no  evidence of prolapse.    Cervix: Grossly normal in appearance, no bleeding  Uterus: Non-enlarged, mobile, normal contour.  No CMT  Adnexa: ovaries non-enlarged, no adnexal masses  Rectal: deferred  Lymphatic: no evidence of inguinal lymphadenopathy Extremities: no edema, erythema, or tenderness Neurologic: Grossly intact Psychiatric: mood appropriate, affect full   Assessment: 38 y.o. [id number] routine annual exam  Plan: Problem List Items Addressed This Visit   None Visit Diagnoses       Encounter for well woman exam with routine gynecological exam    -  Primary   Relevant Medications   etonogestrel  (NEXPLANON ) implant 68 mg (Completed)   Other Relevant Orders   Hemoglobin A1c (Completed)   CBC (Completed)   Comprehensive metabolic panel with GFR (Completed)   Lipid panel (Completed)   TSH (Completed)   Cytology - PAP     Encounter for removal and reinsertion of Nexplanon        Relevant Medications   etonogestrel  (NEXPLANON ) implant 68 mg (Completed)     Screening for diabetes mellitus       Relevant Orders   Hemoglobin A1c (Completed)     Screening for cervical cancer       Relevant Orders  Cytology - PAP     Unable to lose weight       Relevant Orders   TSH (Completed)     Lipid screening       Relevant Orders   Lipid panel (Completed)     Generalized abdominal pain       Relevant Orders   Ambulatory referral to Gastroenterology       1) STI screening  was offered and declined  2)  ASCCP guidelines and rationale discussed.  Patient opts for every 5 years screening interval  3) Contraception - the patient is currently using  Nexplanon .  She is happy with her current form of contraception and plans to continue. See note for removal / reinsertion.  4) Routine healthcare maintenance including cholesterol, diabetes screening discussed Ordered today  5) Return in about 1 year (around 10/07/2024) for Physical, annual established gyn.   Angelita Kendall,  CNM [facility] 10/14/2023 11:35 AM

## 2023-10-15 LAB — CYTOLOGY - PAP
Comment: NEGATIVE
Comment: NEGATIVE
Comment: NEGATIVE
HPV 16: NEGATIVE
HPV 18 / 45: NEGATIVE
High risk HPV: POSITIVE — AB

## 2023-11-26 ENCOUNTER — Ambulatory Visit (INDEPENDENT_AMBULATORY_CARE_PROVIDER_SITE_OTHER): Admitting: Obstetrics & Gynecology

## 2023-11-26 ENCOUNTER — Other Ambulatory Visit (HOSPITAL_COMMUNITY)
Admission: RE | Admit: 2023-11-26 | Discharge: 2023-11-26 | Disposition: A | Discharge status: Home / Self Care | Source: Ambulatory Visit | Attending: Obstetrics & Gynecology | Admitting: Obstetrics & Gynecology

## 2023-11-26 VITALS — BP 105/80 | HR 122 | Ht 61.0 in | Wt 208.8 lb

## 2023-11-26 DIAGNOSIS — Z3202 Encounter for pregnancy test, result negative: Secondary | ICD-10-CM

## 2023-11-26 DIAGNOSIS — Z23 Encounter for immunization: Secondary | ICD-10-CM | POA: Diagnosis not present

## 2023-11-26 DIAGNOSIS — R87612 Low grade squamous intraepithelial lesion on cytologic smear of cervix (LGSIL): Secondary | ICD-10-CM | POA: Insufficient documentation

## 2023-11-26 DIAGNOSIS — B977 Papillomavirus as the cause of diseases classified elsewhere: Secondary | ICD-10-CM

## 2023-11-26 DIAGNOSIS — N871 Moderate cervical dysplasia: Secondary | ICD-10-CM | POA: Diagnosis not present

## 2023-11-26 DIAGNOSIS — Z01812 Encounter for preprocedural laboratory examination: Secondary | ICD-10-CM

## 2023-11-26 LAB — POCT URINE PREGNANCY: Preg Test, Ur: NEGATIVE

## 2023-11-26 NOTE — Progress Notes (Signed)
    GYNECOLOGY PROGRESS NOTE  Subjective:    Patient ID: Meisha Salone, female    DOB: 05/31/1986, 38 y.o.   MRN: [id number]  HPI  Patient is a 38 y.o. [id number] who presents for a colpo due to LGSIL with + HR HPV pap smear. She reports an abnormal pap and colpo about 10 years ago but doesn't think that she had any cervical treatments.  She uses Nexplanon  for contraception and is amenorrheic. She is a smoker and has not had Gardasil.  The following portions of the patient's history were reviewed and updated as appropriate: allergies, current medications, past family history, past medical history, past social history, past surgical history, and problem list.  Review of Systems Pertinent items are noted in HPI.   Objective:   Blood pressure 105/80, pulse (!) 122, height 5\' 1"  (1.549 m), weight 208 lb 12.8 oz (94.7 kg). Body mass index is 39.45 kg/m. Well nourished, well hydrated White female, no apparent distress She is ambulating and conversing normally. UPT negative, consent signed, time out done Speculum placed. Cervix prepped with acetic acid. Transformation zone seen in its entirety. Colpo adequate. Moderate ectropion noted. There was a very tiny piece of acetowhite epithelium at the 3 o'clock position at the outer edge of the ectropion. I biopsied this area and used silver nitrate to achieve hemostasis. ECC obtained. She tolerated the procedure well.    Assessment:   1. 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 Pre-procedure lab exam      Plan:   1. LGSIL on Pap smear of cervix (Primary)   - Surgical pathology. I will send her a Mychart message next Monday with results and treatment plan  - We discussed the increased risk of cervical cancer with tobacco use and I rec'd that she stop or cut back asap - I rec'd that she start a MVI daily.  2. High risk HPV infection - She will start the Gardasil series today.  3. Pre-procedure lab exam  - POCT  urine pregnancy

## 2023-11-29 LAB — SURGICAL PATHOLOGY

## 2023-12-02 ENCOUNTER — Encounter: Payer: Self-pay | Admitting: Obstetrics & Gynecology

## 2023-12-02 NOTE — Telephone Encounter (Signed)
 Pt  has been scheduled for LEEP on August 8 with Dr. Luster Salters.

## 2024-01-21 ENCOUNTER — Ambulatory Visit (INDEPENDENT_AMBULATORY_CARE_PROVIDER_SITE_OTHER): Admitting: Obstetrics and Gynecology

## 2024-01-21 ENCOUNTER — Encounter: Payer: Self-pay | Admitting: Obstetrics and Gynecology

## 2024-01-21 ENCOUNTER — Other Ambulatory Visit (HOSPITAL_COMMUNITY)
Admission: RE | Admit: 2024-01-21 | Discharge: 2024-01-21 | Disposition: A | Discharge status: Home / Self Care | Source: Ambulatory Visit | Attending: Obstetrics and Gynecology | Admitting: Obstetrics and Gynecology

## 2024-01-21 VITALS — BP 113/77 | HR 79 | Ht 61.0 in | Wt 212.3 lb

## 2024-01-21 DIAGNOSIS — D069 Carcinoma in situ of cervix, unspecified: Secondary | ICD-10-CM

## 2024-01-21 DIAGNOSIS — N871 Moderate cervical dysplasia: Secondary | ICD-10-CM | POA: Insufficient documentation

## 2024-01-21 NOTE — Progress Notes (Signed)
 HPI:      Brittney Carpenter is a 38 y.o. [phone] who LMP was No LMP recorded. Patient has had an implant.  Subjective:   She presents today for a possible LEEP.  She has been seeing Dr. Starla who did a colposcopy for her.  Biopsy performed at colposcopy shows CIN-2.  Patient states that she has completed childbearing and the recommendation from Dr. Starla is for the patient to have a LEEP. A significant note the patient does not smoke but she vapes.    Hx: The following portions of the patient's history were reviewed and updated as appropriate:             She  has a past medical history of Asthma. She does not have any pertinent problems on file. She  has a past surgical history that includes Pelvic laparoscopy. Her family history includes Diabetes in her maternal grandmother; Ovarian cancer in her maternal aunt; Stroke in her maternal grandmother; Uterine cancer in her mother. She  reports that she has been smoking e-cigarettes. She has never used smokeless tobacco. She reports that she does not currently use alcohol. She reports that she does not currently use drugs. She has a current medication list which includes the following prescription(s): nexplanon , loratadine, omeprazole, proair hfa, and symbicort. She is allergic to bee venom and penicillins.       Review of Systems:  Review of Systems  Constitutional: Denied constitutional symptoms, night sweats, recent illness, fatigue, fever, insomnia and weight loss.  Eyes: Denied eye symptoms, eye pain, photophobia, vision change and visual disturbance.  Ears/Nose/Throat/Neck: Denied ear, nose, throat or neck symptoms, hearing loss, nasal discharge, sinus congestion and sore throat.  Cardiovascular: Denied cardiovascular symptoms, arrhythmia, chest pain/pressure, edema, exercise intolerance, orthopnea and palpitations.  Respiratory: Denied pulmonary symptoms, asthma, pleuritic pain, productive sputum, cough, dyspnea and wheezing.   Gastrointestinal: Denied, gastro-esophageal reflux, melena, nausea and vomiting.  Genitourinary: Denied genitourinary symptoms including symptomatic vaginal discharge, pelvic relaxation issues, and urinary complaints.  Musculoskeletal: Denied musculoskeletal symptoms, stiffness, swelling, muscle weakness and myalgia.  Dermatologic: Denied dermatology symptoms, rash and scar.  Neurologic: Denied neurology symptoms, dizziness, headache, neck pain and syncope.  Psychiatric: Denied psychiatric symptoms, anxiety and depression.  Endocrine: Denied endocrine symptoms including hot flashes and night sweats.   Meds:   Current Outpatient Medications on File Prior to Visit  Medication Sig Dispense Refill   etonogestrel  (NEXPLANON ) 68 MG IMPL implant 1 each by Subdermal route once.     loratadine (CLARITIN) 10 MG tablet      omeprazole (PRILOSEC) 20 MG capsule      PROAIR HFA 108 (90 Base) MCG/ACT inhaler      SYMBICORT 160-4.5 MCG/ACT inhaler Inhale 2 puffs into the lungs 2 (two) times daily.     No current facility-administered medications on file prior to visit.      Objective:     Vitals:   01/21/24 1335  BP: 113/77  Pulse: 79   Filed Weights   01/21/24 1335  Weight: 212 lb 4.8 oz (96.3 kg)              LEEP The LEEP has been explained to the patient in detail; risks/benefits reviewed.  The risks include, but are not limited to, bleeding, infection, and the possibility of cervical stenosis or cervical incompetence.  The patient had previously been given information regarding abnormal PAP smears and their relationship to HPV.  We have discussed the natural course and history of HPV, the possibility  of incomplete treatment by the LEEP, as well as the possibility of recurrence.  I have reviewed the consent form for LEEP with her, and she fully understands its contents.  We have discussed the procedure itself. I have informed her that following the LEEP she should refrain from intercourse  and the use of tampons for three weeks, and that she should also expect some spotting and brown/black discharge over the next several days.  We have discussed the fact that vaginal bleeding, differentiated from spotting, is not normal and that if she should have this complication, she should contact me immediately.  The follow-up after LEEP will be PAP smears or viral typing performed at regular intervals for up to 3 years.  Should these all prove to be normal, she will then be back on typical cervical screening.  I have answered all of her questions, and I believe she has an adequate understanding of the LEEP, its implications, and the necessity of follow-up care.         LEEP PROCEDURE NOTE  I again discussed her colpo results and explained the procedure of LEEP.  All questions were answered and she signed the consent form.  LEEP performed in the usual manner after reviewing the previous colpo findings and results. Lugol's solution was used to identify any abnormal areas of the cervix.  These were compared with the previous colpo pictures. The cervix was cleansed with betadine solution. Local injection of Lidocaine was performed for anesthesia. 50 Watts Blend current was used to remove the specimen.  It was labeled accordingly. The base and edges of the defect was then cauterized using coagulation current. Monsel's solution was then applied in the usual manner.   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  Follow-up in 1 month for  LEEP follow-up and review of pathology findings.  Pap smear to begin in 1 year. Orders No orders of the defined types were placed in this encounter.   No orders of the defined types were placed in this encounter.     F/U  Return in about 4 weeks (around 02/18/2024).  Alm DOROTHA Sar, M.D. 01/21/2024 3:13 PM

## 2024-01-21 NOTE — Progress Notes (Signed)
 Patient presents today for a LEEP. She recently had an abnormal pap smear and colposcopy resulting in CIN II . No further concerns today.

## 2024-01-23 LAB — SURGICAL PATHOLOGY

## 2024-01-27 ENCOUNTER — Ambulatory Visit

## 2024-01-27 ENCOUNTER — Ambulatory Visit: Payer: Self-pay

## 2024-02-18 ENCOUNTER — Telehealth: Admitting: Obstetrics and Gynecology

## 2024-03-22 ENCOUNTER — Ambulatory Visit (INDEPENDENT_AMBULATORY_CARE_PROVIDER_SITE_OTHER)

## 2024-03-22 ENCOUNTER — Ambulatory Visit
Admission: RE | Admit: 2024-03-22 | Discharge: 2024-03-22 | Disposition: A | Discharge status: Home / Self Care | Payer: Self-pay | Attending: Emergency Medicine | Admitting: Emergency Medicine

## 2024-03-22 VITALS — BP 123/81 | HR 100 | Temp 97.8°F | Resp 18

## 2024-03-22 DIAGNOSIS — R0602 Shortness of breath: Secondary | ICD-10-CM

## 2024-03-22 DIAGNOSIS — J4531 Mild persistent asthma with (acute) exacerbation: Secondary | ICD-10-CM | POA: Diagnosis not present

## 2024-03-22 DIAGNOSIS — R058 Other specified cough: Secondary | ICD-10-CM

## 2024-03-22 DIAGNOSIS — Z760 Encounter for issue of repeat prescription: Secondary | ICD-10-CM

## 2024-03-22 DIAGNOSIS — J0100 Acute maxillary sinusitis, unspecified: Secondary | ICD-10-CM

## 2024-03-22 DIAGNOSIS — H6693 Otitis media, unspecified, bilateral: Secondary | ICD-10-CM

## 2024-03-22 MED ORDER — CEFDINIR 300 MG PO CAPS: 300.0000 mg | ORAL_CAPSULE | Freq: Two times a day (BID) | ORAL | 0 refills | Status: AC

## 2024-03-22 MED ORDER — PREDNISONE 10 MG PO TABS: 40.0000 mg | ORAL_TABLET | Freq: Every day | ORAL | 0 refills | Status: AC

## 2024-03-22 MED ORDER — BUDESONIDE-FORMOTEROL FUMARATE 160-4.5 MCG/ACT IN AERO: 2.0000 | INHALATION_SPRAY | Freq: Two times a day (BID) | RESPIRATORY_TRACT | 0 refills | Status: AC

## 2024-03-22 NOTE — Discharge Instructions (Addendum)
 Continue to use your albuterol inhaler as directed.  Take the cefdinir and prednisone as directed.    1 refill of Symbicort sent to your pharmacy which hopefully you will be able to get today and get restarted on.    Follow up with your primary care provider tomorrow.  Go to the emergency department if you have worsening symptoms.

## 2024-03-22 NOTE — ED Triage Notes (Addendum)
 Patient to Urgent Care with complaints of ear fullness/ nasal congestion/ facial pain/ SHOB. No fevers.   Symptoms x10 days.  Meds: taking allergy meds/ inhalers (requests refill of Symbicort) / dayquil and nyquil.

## 2024-03-22 NOTE — ED Provider Notes (Signed)
 [name]    CSN: [id number] Arrival date & time: 03/22/24  1316      History   Chief Complaint Chief Complaint  Patient presents with   Cough    Ear fullnes,nose stopped and congestion - Entered by patient    HPI Brittney Carpenter is a 38 y.o. female.  Patient presents with 10-day history of sinus pressure, congestion, ear pain, productive cough, shortness of breath.  No fever or chest pain.  Patient has been needing to use her albuterol inhaler more often.  She also has been taking OTC cold and allergy medications without relief.  She is currently out of her Symbicort and has to see her PCP before she can get a refill.  Her medical history includes asthma.  Patient vapes daily.  The history is provided by the patient and medical records.    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 Authorizing Provider  budesonide-formoterol (SYMBICORT) 160-4.5 MCG/ACT inhaler Inhale 2 puffs into the lungs 2 (two) times daily. 03/22/24  Yes Corlis Burnard DEL, NP  cefdinir (OMNICEF) 300 MG capsule Take 1 capsule (300 mg total) by mouth 2 (two) times daily for 10 days. 03/22/24 04/01/24 Yes Corlis Burnard DEL, NP  predniSONE (DELTASONE) 10 MG  tablet Take 4 tablets (40 mg total) by mouth daily for 5 days. 03/22/24 03/27/24 Yes Corlis Burnard DEL, NP  etonogestrel  (NEXPLANON ) 68 MG IMPL implant 1 each by Subdermal route once.    [provider]  loratadine (CLARITIN) 10 MG tablet  07/15/20   [provider]  omeprazole (PRILOSEC) 20 MG capsule  07/15/20   [provider]  PROAIR HFA 108 (90 Base) MCG/ACT inhaler  07/15/20   [provider]    Family History Family History  Problem Relation Age of Onset   Uterine cancer Mother    Ovarian cancer Maternal Aunt    Diabetes Maternal Grandmother    Stroke Maternal Grandmother     Social History Social History   Tobacco Use   Smoking status: Every Day    Types: E-cigarettes   Smokeless tobacco: Never  Vaping Use   Vaping status: Every Day  Substance Use Topics   Alcohol use: Not Currently   Drug use: Not Currently     Allergies   Bee venom and Penicillins   Review of Systems Review of Systems  Constitutional:  Negative for chills and fever.  HENT:  Positive for  congestion, ear pain and sinus pressure. Negative for sore throat.   Respiratory:  Positive for cough, shortness of breath and wheezing.      Physical Exam Triage Vital Signs ED Triage Vitals [03/22/24 1332]  Encounter Vitals Group     BP 123/81     Girls Systolic BP Percentile      Girls Diastolic BP Percentile      Boys Systolic BP Percentile      Boys Diastolic BP Percentile      Pulse Rate 100     Resp 18     Temp 97.8 F (36.6 C)     Temp src      SpO2 97 %     Weight      Height      Head Circumference      Peak Flow      Pain Score      Pain Loc      Pain Education      Exclude from Growth Chart    No data found.  Updated Vital Signs BP 123/81   Pulse 100   Temp 97.8 F (36.6 C)   Resp 18   SpO2 97%   Visual Acuity Right Eye Distance:   Left Eye Distance:   Bilateral Distance:    Right Eye Near:   Left Eye Near:    Bilateral Near:      Physical Exam Constitutional:      General: She is not in acute distress. HENT:     Right Ear: Tympanic membrane is erythematous.     Left Ear: Tympanic membrane is erythematous.     Nose: Congestion present.     Mouth/Throat:     Mouth: Mucous membranes are moist.     Pharynx: Oropharynx is clear.  Cardiovascular:     Rate and Rhythm: Normal rate and regular rhythm.     Heart sounds: Normal heart sounds.  Pulmonary:     Effort: Pulmonary effort is normal. No respiratory distress.     Breath sounds: Wheezing and rhonchi present.     Comments: Bilateral rhonchi and wheezing. Neurological:     Mental Status: She is alert.      UC Treatments / Results  Labs (all labs ordered are listed, but only abnormal results are displayed) Labs Reviewed - No data to display  EKG   Radiology DG Chest 2 View Result Date: 03/22/2024 CLINICAL DATA:  productive cough, SOB EXAM: CHEST - 2 VIEW COMPARISON:  None Available. FINDINGS: The heart and mediastinal contours are within normal limits. No focal consolidation. No pulmonary edema. No pleural effusion. No pneumothorax. No acute osseous abnormality. IMPRESSION: No active cardiopulmonary disease. Electronically Signed   By: Morgane  Naveau M.D.   On: 03/22/2024 14:30    Procedures Procedures (including critical care time)  Medications Ordered in UC Medications - No data to display  Initial Impression / Assessment and Plan / UC Course  I have reviewed the triage vital signs and the nursing notes.  Pertinent labs & imaging results that were available during my care of the patient were reviewed by me and considered in my medical decision making (see chart for details).   Productive cough, shortness of breath, asthma exacerbation, acute sinusitis, bilateral otitis media, medication refill.  Afebrile and vital signs are stable.  CXR negative.  Patient has been out of her Symbicort for a few weeks.  1 refill of Symbicort sent to her pharmacy,  along with 5-day course of prednisone and  7-day course of cefdinir.  Patient is allergic to penicillin which she states was from childhood and she does not know what reaction she had.  Instructed patient to follow-up with her PCP tomorrow.  ED precautions given.  Education provided on asthma, shortness of breath, sinus infection.  She agrees to plan of care.   Final Clinical Impressions(s) / UC Diagnoses   Final diagnoses:  Productive cough  Shortness of breath  Mild persistent asthma with acute exacerbation  Acute non-recurrent maxillary sinusitis  Bilateral otitis media, unspecified otitis media type  Medication refill     Discharge Instructions      Continue to use your albuterol inhaler as directed.  Take the cefdinir and prednisone as directed.    1 refill of Symbicort sent to your pharmacy which hopefully you will be able to get today and get restarted on.    Follow up with your primary care provider tomorrow.  Go to the emergency department if you have worsening symptoms.        ED Prescriptions     Medication Sig Dispense Auth. Provider   predniSONE (DELTASONE) 10 MG tablet Take 4 tablets (40 mg total) by mouth daily for 5 days. 20 tablet Corlis Burnard DEL, NP   budesonide-formoterol (SYMBICORT) 160-4.5 MCG/ACT inhaler Inhale 2 puffs into the lungs 2 (two) times daily. 1 each Corlis Burnard DEL, NP   cefdinir (OMNICEF) 300 MG capsule Take 1 capsule (300 mg total) by mouth 2 (two) times daily for 10 days. 20 capsule Corlis Burnard DEL, NP      PDMP not reviewed this encounter.   Corlis Burnard DEL, NP 03/22/24 [phone]

## 2024-04-27 ENCOUNTER — Ambulatory Visit

## 2024-04-27 DIAGNOSIS — K200 Eosinophilic esophagitis: Secondary | ICD-10-CM | POA: Diagnosis not present

## 2024-04-27 DIAGNOSIS — K6289 Other specified diseases of anus and rectum: Secondary | ICD-10-CM | POA: Diagnosis not present

## 2024-04-27 DIAGNOSIS — K222 Esophageal obstruction: Secondary | ICD-10-CM | POA: Diagnosis not present

## 2024-04-27 DIAGNOSIS — K640 First degree hemorrhoids: Secondary | ICD-10-CM | POA: Diagnosis not present

## 2024-04-28 ENCOUNTER — Encounter: Payer: Self-pay | Admitting: Obstetrics and Gynecology

## 2024-05-27 ENCOUNTER — Ambulatory Visit

## 2024-08-10 ENCOUNTER — Encounter
